# Patient Record
Sex: Female | Born: 1995
Health system: Southern US, Community
[De-identification: ages and names within clinical notes are randomized; demographics above are authoritative.]

## PROBLEM LIST (undated history)

## (undated) DIAGNOSIS — F419 Anxiety disorder, unspecified: Secondary | ICD-10-CM

## (undated) DIAGNOSIS — I700 Atherosclerosis of aorta: Secondary | ICD-10-CM

## (undated) DIAGNOSIS — M779 Enthesopathy, unspecified: Secondary | ICD-10-CM

## (undated) DIAGNOSIS — J329 Chronic sinusitis, unspecified: Secondary | ICD-10-CM

## (undated) DIAGNOSIS — F329 Major depressive disorder, single episode, unspecified: Secondary | ICD-10-CM

## (undated) DIAGNOSIS — F32A Depression, unspecified: Secondary | ICD-10-CM

## (undated) HISTORY — DX: Atherosclerosis of aorta: I70.0

## (undated) HISTORY — PX: WISDOM TOOTH EXTRACTION: SHX21

## (undated) HISTORY — DX: Enthesopathy, unspecified: M77.9

## (undated) HISTORY — DX: Chronic sinusitis, unspecified: J32.9

---
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
## 2015-04-24 ENCOUNTER — Encounter: Payer: Self-pay | Admitting: Family Medicine

## 2015-04-24 ENCOUNTER — Ambulatory Visit (INDEPENDENT_AMBULATORY_CARE_PROVIDER_SITE_OTHER): Payer: BLUE CROSS/BLUE SHIELD | Admitting: Family Medicine

## 2015-04-24 VITALS — BP 129/71 | HR 71 | Temp 97.5°F | Ht 65.0 in | Wt 217.0 lb

## 2015-04-24 DIAGNOSIS — F339 Major depressive disorder, recurrent, unspecified: Secondary | ICD-10-CM | POA: Insufficient documentation

## 2015-04-24 DIAGNOSIS — M545 Low back pain, unspecified: Secondary | ICD-10-CM

## 2015-04-24 DIAGNOSIS — F330 Major depressive disorder, recurrent, mild: Secondary | ICD-10-CM

## 2015-04-24 DIAGNOSIS — N926 Irregular menstruation, unspecified: Secondary | ICD-10-CM | POA: Diagnosis not present

## 2015-04-24 DIAGNOSIS — R635 Abnormal weight gain: Secondary | ICD-10-CM | POA: Diagnosis not present

## 2015-04-24 DIAGNOSIS — Z0000 Encounter for general adult medical examination without abnormal findings: Secondary | ICD-10-CM | POA: Insufficient documentation

## 2015-04-24 MED ORDER — ESCITALOPRAM OXALATE 10 MG PO TABS: 10.0000 mg | ORAL_TABLET | Freq: Every day | ORAL | Status: DC

## 2015-04-24 NOTE — Progress Notes (Signed)
BP 129/71 mmHg  Pulse 71  Temp(Src) 97.5 F (36.4 C)  Ht 5\' 5"  (1.651 m)  Wt 217 lb (98.431 kg)  BMI 36.11 kg/m2  SpO2 98%  LMP 03/22/2015 (Exact Date)   Subjective:    Patient ID: Yolanda Gardner, female    DOB: 1996/07/18, 19 y.o.   MRN: [id number]  HPI: Yolanda Gardner is a 19 y.o. female  Chief Complaint  Patient presents with  . Establish Care  . Depression    She has never been diagnosed with Depression, but feels like she may have it.   She has started to have problems with mood for 4-5 years; does not feel as good as she could; gets down once in a while; knows   Sleeps a little better now that she is back to sleep; over the summer would have trouble falling asleep but also early morning awakenings No alcohol; she does exercise, new gym at school and does treadmill and sit-ups; sleeping better now and walking a lot on campus and that is helping her get tuckered She does have some fatigue; mood does get worse during the winter; as it turns to winter, she is going to get worse; during the winter last year, friends could tell Does eat meat; does take multiple vitamin Depression runs in the family, mother has depression and is on medicine and does well with medicine; thinks she takes escitalopram  Depression screen [facility] 2/9 04/24/2015  Decreased Interest 1  Down, Depressed, Hopeless 2  PHQ - 2 Score 3  Altered sleeping 3  Tired, decreased energy 1  Change in appetite 3  Feeling bad or failure about yourself  2  Trouble concentrating 3  Moving slowly or fidgety/restless 1  Suicidal thoughts 1  PHQ-9 Score 17   She also has had some back problems; went to sports medicine doctor at school; her legs are uneven; she had pelvic tilt and she walks around on concrete a lot; pain is in the lower back; muscles will tighten up; going on for one year; mostly lower back; no shoe inserts; no b/b dysfunction; no numbness or tingling down the legs; comes and goes, not specifically  with any injury  Relevant past medical, surgical, family and social history reviewed and updated as indicated. Interim medical history since our last visit reviewed. Allergies and medications reviewed and updated. Mat aunt diabetes, mother depression, no known thyroid trouble in the family  Review of Systems  Constitutional: Positive for unexpected weight change (weight gain of maybe 15 pounds over summer).  Eyes: Negative for visual disturbance.  Gastrointestinal: Negative for blood in stool.  Endocrine: Positive for heat intolerance and polydipsia (lately, used to constantly drink water, more sodas during the summer). Negative for cold intolerance.  Genitourinary: Negative for hematuria.  Hematological: Does not bruise/bleed easily (just a couple bruises on the legs, but expected and active).  Psychiatric/Behavioral: Positive for decreased concentration (trouble with reading, staying on task, drifts off while reading, that's new). Negative for behavioral problems, confusion and self-injury. The patient is not nervous/anxious.   Periods have been irregular; did not have a period for four months; usually monthly or a little later;  Not sexually active No hair growth or acne  Per HPI unless specifically indicated above     Objective:    BP 129/71 mmHg  Pulse 71  Temp(Src) 97.5 F (36.4 C)  Ht 5\' 5"  (1.651 m)  Wt 217 lb (98.431 kg)  BMI 36.11 kg/m2  SpO2 98%  LMP 03/22/2015 (Exact Date)  Wt Readings from Last 3 Encounters:  04/24/15 217 lb (98.431 kg) (99 %*, Z = 2.17)   * Growth percentiles are based on CDC 2-20 Years data.    Physical Exam  Constitutional: She appears well-developed and well-nourished. No distress.  HENT:  Head: Normocephalic and atraumatic.  Eyes: EOM are normal. No scleral icterus.  Neck: No thyromegaly present.  Cardiovascular: Normal rate, regular rhythm and normal heart sounds.   No murmur heard. Pulmonary/Chest: Effort normal and breath sounds  normal. No respiratory distress. She has no wheezes.  Abdominal: She exhibits no distension.  Musculoskeletal: Normal range of motion. She exhibits no edema.       Lumbar back: She exhibits normal range of motion, no tenderness, no bony tenderness, no deformity and no spasm.  Neurological: She is alert. She displays no atrophy and no tremor. She exhibits normal muscle tone. Gait normal.  Skin: Skin is warm and dry. She is not diaphoretic. No pallor.  Psychiatric: She has a normal mood and affect. Her behavior is normal. Judgment and thought content normal.      Assessment & Plan:   Problem List Items Addressed This Visit      Other   Depression, major, recurrent - Primary    Discussed dx, treatment option, risk of suicidal ideation on medicine and reasons to seek immediate care; she agrees with starting medicine; close f/u, call before appt if needed; she demonstrates good insight, maturity so frontal lobe myelination may be more complete      Relevant Medications   escitalopram (LEXAPRO) 10 MG tablet   Other Relevant Orders   Vit D  25 hydroxy (rtn osteoporosis monitoring) (Completed)   Abnormal weight gain    Check TSH today      Relevant Orders   TSH (Completed)   Preventative health care    Physical not done today, just using code for the preventive health labs that were ordered      Relevant Orders   CBC with Differential/Platelet (Completed)   Comprehensive metabolic panel (Completed)   Lipid Panel w/o Chol/HDL Ratio (Completed)   Irregular periods    Check TSH; no acne or hirsutism, so will not start work-up for PCOS, though she is obese; monitor      Relevant Orders   TSH (Completed)   Bilateral low back pain without sciatica    No red flags; no need for imaging; will refer to PT      Relevant Orders   Ambulatory referral to Physical Therapy      Follow up plan: Return in about 4 weeks (around 05/22/2015) for medication follow-up.  An after-visit summary  was printed and given to the patient at check-out.  Please see the patient instructions which may contain other information and recommendations beyond what is mentioned above in the assessment and plan. Meds ordered this encounter  Medications  . PATADAY 0.2 % SOLN    Sig:     Refill:  0  . escitalopram (LEXAPRO) 10 MG tablet    Sig: Take 1 tablet (10 mg total) by mouth daily.    Dispense:  30 tablet    Refill:  0   Orders Placed This Encounter  Procedures  . CBC with Differential/Platelet  . Comprehensive metabolic panel  . Lipid Panel w/o Chol/HDL Ratio  . TSH  . Vit D  25 hydroxy (rtn osteoporosis monitoring)  . Ambulatory referral to Physical Therapy

## 2015-04-24 NOTE — Patient Instructions (Signed)
Try turmeric as a natural anti-inflammatory (for pain and arthritis). It comes in capsules where you buy aspirin and fish oil, but also as a spice where you buy pepper and garlic powder. We'll have you see the physical therapist We'll contact you about the lab results If you have not heard anything from my staff in a week about any orders/referrals/studies from today, please contact us here to follow-up [phone] Start the new medicine  Call me before next appt or seek medical help if needed or dark thoughts develop Return in 3-4 weeks

## 2015-04-25 LAB — COMPREHENSIVE METABOLIC PANEL
A/G RATIO: 1.5 (ref 1.1–2.5)
ALBUMIN: 4.4 g/dL (ref 3.5–5.5)
ALK PHOS: 86 IU/L (ref 43–101)
ALT: 32 IU/L (ref 0–32)
AST: 27 IU/L (ref 0–40)
BILIRUBIN TOTAL: 0.2 mg/dL (ref 0.0–1.2)
BUN / CREAT RATIO: 15 (ref 8–20)
BUN: 10 mg/dL (ref 6–20)
CHLORIDE: 100 mmol/L (ref 97–108)
CO2: 23 mmol/L (ref 18–29)
CREATININE: 0.65 mg/dL (ref 0.57–1.00)
Calcium: 9.5 mg/dL (ref 8.7–10.2)
GFR calc Af Amer: 150 mL/min/{1.73_m2} (ref >59)
GFR calc non Af Amer: 130 mL/min/{1.73_m2} (ref >59)
GLOBULIN, TOTAL: 3 g/dL (ref 1.5–4.5)
Glucose: 92 mg/dL (ref 65–99)
POTASSIUM: 4.4 mmol/L (ref 3.5–5.2)
SODIUM: 140 mmol/L (ref 134–144)
Total Protein: 7.4 g/dL (ref 6.0–8.5)

## 2015-04-25 LAB — LIPID PANEL W/O CHOL/HDL RATIO
CHOLESTEROL TOTAL: 187 mg/dL — AB (ref 100–169)
HDL: 45 mg/dL (ref >39)
LDL CALC: 120 mg/dL — AB (ref 0–109)
TRIGLYCERIDES: 112 mg/dL — AB (ref 0–89)
VLDL Cholesterol Cal: 22 mg/dL (ref 5–40)

## 2015-04-25 LAB — CBC WITH DIFFERENTIAL/PLATELET
Basophils Absolute: 0 10*3/uL (ref 0.0–0.2)
Basos: 1 %
EOS (ABSOLUTE): 0.1 10*3/uL (ref 0.0–0.4)
EOS: 2 %
HEMATOCRIT: 38.7 % (ref 34.0–46.6)
HEMOGLOBIN: 12.3 g/dL (ref 11.1–15.9)
Immature Grans (Abs): 0 10*3/uL (ref 0.0–0.1)
Immature Granulocytes: 0 %
LYMPHS ABS: 2.2 10*3/uL (ref 0.7–3.1)
Lymphs: 32 %
MCH: 25.4 pg — AB (ref 26.6–33.0)
MCHC: 31.8 g/dL (ref 31.5–35.7)
MCV: 80 fL (ref 79–97)
MONOCYTES: 9 %
Monocytes Absolute: 0.6 10*3/uL (ref 0.1–0.9)
NEUTROS ABS: 4 10*3/uL (ref 1.4–7.0)
Neutrophils: 56 %
Platelets: 425 10*3/uL — ABNORMAL HIGH (ref 150–379)
RBC: 4.85 x10E6/uL (ref 3.77–5.28)
RDW: 15.5 % — AB (ref 12.3–15.4)
WBC: 7 10*3/uL (ref 3.4–10.8)

## 2015-04-25 LAB — VITAMIN D 25 HYDROXY (VIT D DEFICIENCY, FRACTURES): Vit D, 25-Hydroxy: 19 ng/mL — ABNORMAL LOW (ref 30.0–100.0)

## 2015-04-25 LAB — TSH: TSH: 1.02 u[IU]/mL (ref 0.450–4.500)

## 2015-04-26 DIAGNOSIS — M545 Low back pain, unspecified: Secondary | ICD-10-CM | POA: Insufficient documentation

## 2015-04-26 NOTE — Assessment & Plan Note (Signed)
Physical not done today, just using code for the preventive health labs that were ordered

## 2015-04-26 NOTE — Assessment & Plan Note (Signed)
Check TSH; no acne or hirsutism, so will not start work-up for PCOS, though she is obese; monitor

## 2015-04-26 NOTE — Assessment & Plan Note (Signed)
Discussed dx, treatment option, risk of suicidal ideation on medicine and reasons to seek immediate care; she agrees with starting medicine; close f/u, call before appt if needed; she demonstrates good insight, maturity so frontal lobe myelination may be more complete

## 2015-04-26 NOTE — Assessment & Plan Note (Signed)
Check TSH today

## 2015-04-26 NOTE — Assessment & Plan Note (Signed)
No red flags; no need for imaging; will refer to PT

## 2015-04-27 ENCOUNTER — Encounter: Payer: Self-pay | Admitting: Family Medicine

## 2015-05-25 ENCOUNTER — Ambulatory Visit (INDEPENDENT_AMBULATORY_CARE_PROVIDER_SITE_OTHER): Payer: BLUE CROSS/BLUE SHIELD | Admitting: Family Medicine

## 2015-05-25 ENCOUNTER — Encounter: Payer: Self-pay | Admitting: Family Medicine

## 2015-05-25 VITALS — BP 105/69 | HR 65 | Temp 97.5°F | Wt 219.0 lb

## 2015-05-25 DIAGNOSIS — F330 Major depressive disorder, recurrent, mild: Secondary | ICD-10-CM

## 2015-05-25 DIAGNOSIS — E559 Vitamin D deficiency, unspecified: Secondary | ICD-10-CM | POA: Diagnosis not present

## 2015-05-25 DIAGNOSIS — M79601 Pain in right arm: Secondary | ICD-10-CM

## 2015-05-25 DIAGNOSIS — R6884 Jaw pain: Secondary | ICD-10-CM | POA: Diagnosis not present

## 2015-05-25 MED ORDER — ESCITALOPRAM OXALATE 10 MG PO TABS: 10.0000 mg | ORAL_TABLET | Freq: Every day | ORAL | Status: DC

## 2015-05-25 NOTE — Assessment & Plan Note (Signed)
Start supplementation of OTC vitamin D3; will have her take 2,000 iu daily for 3-4 months and then 1,000 iu daily; get some time outdoors (weather permitting)
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
## 2015-05-25 NOTE — Assessment & Plan Note (Signed)
Doing much better on the SSRI; continue same; PHQ-2 score went form 17 to 0; refills of medicine provided; call before next f/u if any problems
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
## 2015-11-24 ENCOUNTER — Ambulatory Visit (INDEPENDENT_AMBULATORY_CARE_PROVIDER_SITE_OTHER): Payer: BLUE CROSS/BLUE SHIELD | Admitting: Family Medicine

## 2015-11-24 ENCOUNTER — Encounter: Payer: Self-pay | Admitting: Family Medicine

## 2015-11-24 VITALS — BP 114/72 | HR 65 | Temp 98.3°F | Ht 64.5 in | Wt 225.0 lb

## 2015-11-24 DIAGNOSIS — F3341 Major depressive disorder, recurrent, in partial remission: Secondary | ICD-10-CM | POA: Diagnosis not present

## 2015-11-24 DIAGNOSIS — E559 Vitamin D deficiency, unspecified: Secondary | ICD-10-CM

## 2015-11-24 DIAGNOSIS — Z23 Encounter for immunization: Secondary | ICD-10-CM | POA: Diagnosis not present

## 2015-11-24 MED ORDER — ESCITALOPRAM OXALATE 10 MG PO TABS: 10.0000 mg | ORAL_TABLET | Freq: Every day | ORAL | Status: DC

## 2015-11-24 NOTE — Progress Notes (Signed)
BP 114/72 mmHg  Pulse 65  Temp(Src) 98.3 F (36.8 C)  Ht 5' 4.5" (1.638 m)  Wt 225 lb (102.059 kg)  BMI 38.04 kg/m2  SpO2 98%  LMP 11/02/2015 (Exact Date)   Subjective:    Patient ID: Yolanda Gardner, female    DOB: July 11, 1996, 20 y.o.   MRN: [id number]  HPI: Yolanda Gardner is a 20 y.o. female  Chief Complaint  Patient presents with  . Depression    follow up   Depression screen [facility] 2/9 11/24/2015 05/25/2015 04/24/2015  Decreased Interest 0 0 1  Down, Depressed, Hopeless 1 0 2  PHQ - 2 Score 1 0 3  Altered sleeping - - 3  Tired, decreased energy - - 1  Change in appetite - - 3  Feeling bad or failure about yourself  - - 2  Trouble concentrating - - 3  Moving slowly or fidgety/restless - - 1  Suicidal thoughts - - 1  PHQ-9 Score - - 17   Did get flu shot today; has not been sick Here primarily for follow-up of depression Doing well with mood and medicines; taking escitalopram for about 6 months; no shaking or tremors; sleeping okay; just feels a little down with social issues, has a cause; not out of nowhere; not having tearfulness; no hopelessness Taking vitamin D supplement, 1000 iu daily; we reviewed previous labs  Relevant past medical, surgical, social history reviewed and updated as indicated. Interim medical history since our last visit reviewed. Allergies and medications reviewed and updated.  Review of Systems Per HPI unless specifically indicated above     Objective:    BP 114/72 mmHg  Pulse 65  Temp(Src) 98.3 F (36.8 C)  Ht 5' 4.5" (1.638 m)  Wt 225 lb (102.059 kg)  BMI 38.04 kg/m2  SpO2 98%  LMP 11/02/2015 (Exact Date)  Wt Readings from Last 3 Encounters:  11/24/15 225 lb (102.059 kg) (99 %*, Z = 2.26)  05/25/15 219 lb (99.338 kg) (99 %*, Z = 2.19)  04/24/15 217 lb (98.431 kg) (99 %*, Z = 2.17)   * Growth percentiles are based on CDC 2-20 Years data.    Physical Exam  Constitutional: She appears well-developed and well-nourished. No  distress.  Eyes: EOM are normal. No scleral icterus.  Cardiovascular: Normal rate and regular rhythm.   Pulmonary/Chest: Effort normal and breath sounds normal.  Psychiatric: She has a normal mood and affect. Her behavior is normal. Judgment and thought content normal. She expresses no suicidal ideation.   Results for orders placed or performed in visit on 04/24/15  CBC with Differential/Platelet  Result Value Ref Range   WBC 7.0 3.4 - 10.8 x10E3/uL   RBC 4.85 3.77 - 5.28 x10E6/uL   Hemoglobin 12.3 11.1 - 15.9 g/dL   Hematocrit 40.9 81.1 - 46.6 %   MCV 80 79 - 97 fL   MCH 25.4 (L) 26.6 - 33.0 pg   MCHC 31.8 31.5 - 35.7 g/dL   RDW 91.4 (H) 78.2 - 95.6 %   Platelets 425 (H) 150 - 379 x10E3/uL   Neutrophils 56 %   Lymphs 32 %   Monocytes 9 %   Eos 2 %   Basos 1 %   Neutrophils Absolute 4.0 1.4 - 7.0 x10E3/uL   Lymphocytes Absolute 2.2 0.7 - 3.1 x10E3/uL   Monocytes Absolute 0.6 0.1 - 0.9 x10E3/uL   EOS (ABSOLUTE) 0.1 0.0 - 0.4 x10E3/uL   Basophils Absolute 0.0 0.0 - 0.2 x10E3/uL  Immature Granulocytes 0 %   Immature Grans (Abs) 0.0 0.0 - 0.1 x10E3/uL  Comprehensive metabolic panel  Result Value Ref Range   Glucose 92 65 - 99 mg/dL   BUN 10 6 - 20 mg/dL   Creatinine, Ser 1.470.65 0.57 - 1.00 mg/dL   GFR calc non Af Amer 130 >59 mL/min/1.73   GFR calc Af Amer 150 >59 mL/min/1.73   BUN/Creatinine Ratio 15 8 - 20   Sodium 140 134 - 144 mmol/L   Potassium 4.4 3.5 - 5.2 mmol/L   Chloride 100 97 - 108 mmol/L   CO2 23 18 - 29 mmol/L   Calcium 9.5 8.7 - 10.2 mg/dL   Total Protein 7.4 6.0 - 8.5 g/dL   Albumin 4.4 3.5 - 5.5 g/dL   Globulin, Total 3.0 1.5 - 4.5 g/dL   Albumin/Globulin Ratio 1.5 1.1 - 2.5   Bilirubin Total 0.2 0.0 - 1.2 mg/dL   Alkaline Phosphatase 86 43 - 101 IU/L   AST 27 0 - 40 IU/L   ALT 32 0 - 32 IU/L  Lipid Panel w/o Chol/HDL Ratio  Result Value Ref Range   Cholesterol, Total 187 (H) 100 - 169 mg/dL   Triglycerides 829112 (H) 0 - 89 mg/dL   HDL 45 >56>39 mg/dL    VLDL Cholesterol Cal 22 5 - 40 mg/dL   LDL Calculated 213120 (H) 0 - 109 mg/dL  TSH  Result Value Ref Range   TSH 1.020 0.450 - 4.500 uIU/mL  Vit D  25 hydroxy (rtn osteoporosis monitoring)  Result Value Ref Range   Vit D, 25-Hydroxy 19.0 (L) 30.0 - 100.0 ng/mL      Assessment & Plan:   Problem List Items Addressed This Visit      Other   Depression, major, recurrent (HCC) - Primary    Doing well on current dose of medicine; continue same; refills provided for one year, as she does not wish to stop medicine; of course, call sooner if any issues      Relevant Medications   escitalopram (LEXAPRO) 10 MG tablet   Vitamin D deficiency    Continue 1,000 iu vitamin D3; reviewed last lab results and instructions for labs; she does not wish to recheck the level today       Other Visit Diagnoses    Needs flu shot        Encounter for immunization        Relevant Orders    Flu Vaccine QUAD 36+ mos IM       Follow up plan: Return in about 1 year (around 11/23/2016) for follow-up.  Meds ordered this encounter  Medications  . escitalopram (LEXAPRO) 10 MG tablet    Sig: Take 1 tablet (10 mg total) by mouth daily.    Dispense:  30 tablet    Refill:  11   An after-visit summary was printed and given to the patient at check-out.  Please see the patient instructions which may contain other information and recommendations beyond what is mentioned above in the assessment and plan.

## 2015-11-24 NOTE — Assessment & Plan Note (Signed)
Continue 1,000 iu vitamin D3; reviewed last lab results and instructions for labs; she does not wish to recheck the level today

## 2015-11-24 NOTE — Assessment & Plan Note (Signed)
Doing well on current dose of medicine; continue same; refills provided for one year, as she does not wish to stop medicine; of course, call sooner if any issues

## 2015-11-24 NOTE — Patient Instructions (Addendum)
Return in one year, but call sooner if needed Continue medicines as before You received the flu shot today; it should protect you against the flu virus over the coming months; it will take about two weeks for antibodies to develop; do try to stay away from hospitals, nursing homes, and daycares during peak flu season; taking extra vitamin C daily during flu season may help you avoid getting sick

## 2016-11-18 DIAGNOSIS — J019 Acute sinusitis, unspecified: Secondary | ICD-10-CM | POA: Diagnosis not present

## 2016-11-21 DIAGNOSIS — M25531 Pain in right wrist: Secondary | ICD-10-CM | POA: Diagnosis not present

## 2016-12-26 ENCOUNTER — Ambulatory Visit (INDEPENDENT_AMBULATORY_CARE_PROVIDER_SITE_OTHER): Payer: BLUE CROSS/BLUE SHIELD | Admitting: Family Medicine

## 2016-12-26 ENCOUNTER — Encounter: Payer: Self-pay | Admitting: Family Medicine

## 2016-12-26 VITALS — BP 124/82 | HR 68 | Temp 98.3°F | Resp 16 | Wt 231.3 lb

## 2016-12-26 DIAGNOSIS — F3341 Major depressive disorder, recurrent, in partial remission: Secondary | ICD-10-CM | POA: Diagnosis not present

## 2016-12-26 DIAGNOSIS — R059 Cough, unspecified: Secondary | ICD-10-CM

## 2016-12-26 DIAGNOSIS — R05 Cough: Secondary | ICD-10-CM

## 2016-12-26 MED ORDER — BENZONATATE 100 MG PO CAPS: 100.0000 mg | ORAL_CAPSULE | Freq: Three times a day (TID) | ORAL | 0 refills | Status: DC | PRN

## 2016-12-26 MED ORDER — ESCITALOPRAM OXALATE 10 MG PO TABS: 10.0000 mg | ORAL_TABLET | Freq: Every day | ORAL | 3 refills | Status: DC

## 2016-12-26 NOTE — Assessment & Plan Note (Signed)
Discussed seasonal affective disorder, light therapy; hand-out from [facility] clinic given; will consider increasing lexapro to 20 mg during winter months; she agrees; continue 10 mg daily for now; call with any issues

## 2016-12-26 NOTE — Patient Instructions (Addendum)
Consider light therapy for December, January, February Let's do increase the Lexapro in December to 20 mg daily, just call me for a new prescription, and we can do that through winter

## 2016-12-26 NOTE — Progress Notes (Signed)
BP 124/82   Pulse 68   Temp 98.3 F (36.8 C) (Oral)   Resp 16   Wt 231 lb 4.8 oz (104.9 kg)   LMP 12/23/2016   SpO2 96%   BMI 39.09 kg/m    Subjective:    Patient ID: Yolanda Gardner, female    DOB: Mar 25, 1996, 20 y.o.   MRN: [id number]  HPI: Yolanda Gardner is a 21 y.o. female  Chief Complaint  Patient presents with  . Depression   HPI Patient is here for f/u She thinks she might benefit from a higher dose of lexapro during the winter Not sure if seasonal affect disorder No SI/HI Time for refill Taking 10 mg lexapro Can get through Christmas Getting self care  Can't seem to shake a cough; had a sinus infection one month ago; drainage got into lungs; no blood, no fever; some allergies, no recent postnasal drip; more with exertion, irritated  Depression screen [facility] 2/9 12/26/2016 11/24/2015 05/25/2015 04/24/2015  Decreased Interest 0 0 0 1  Down, Depressed, Hopeless 0 1 0 2  PHQ - 2 Score 0 1 0 3  Altered sleeping - - - 3  Tired, decreased energy - - - 1  Change in appetite - - - 3  Feeling bad or failure about yourself  - - - 2  Trouble concentrating - - - 3  Moving slowly or fidgety/restless - - - 1  Suicidal thoughts - - - 1  PHQ-9 Score - - - 17   Relevant past medical, surgical, family and social history reviewed Past Medical History:  Diagnosis Date  . Sinus infection   . Tendonitis    No past surgical history on file. Family History  Problem Relation Age of Onset  . Depression Mother   . Hypertension Maternal Grandmother   . Diabetes Maternal Aunt    Social History  Substance Use Topics  . Smoking status: Never Smoker  . Smokeless tobacco: Never Used  . 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 Allergies and medications reviewed  Review of Systems Per HPI unless specifically indicated above     Objective:    BP 124/82   Pulse 68   Temp 98.3 F  (36.8 C) (Oral)   Resp 16   Wt 231 lb 4.8 oz (104.9 kg)   LMP 12/23/2016   SpO2 96%   BMI 39.09 kg/m   Wt Readings from Last 3 Encounters:  12/26/16 231 lb 4.8 oz (104.9 kg)  11/24/15 225 lb (102.1 kg) (99 %, Z= 2.26)*  05/25/15 219 lb (99.3 kg) (99 %, Z= 2.19)*   * Growth percentiles are based on CDC 2-20 Years data.    Physical Exam  Constitutional: She appears well-developed and well-nourished. No distress.  HENT:  Mouth/Throat: Mucous membranes are normal.  Eyes: EOM are normal. No scleral icterus.  Cardiovascular: Normal rate and regular rhythm.   Pulmonary/Chest: Effort normal and breath sounds normal.  Psychiatric: She has a normal mood and affect. Her behavior is normal.   Results for orders placed or performed in visit on 04/24/15  CBC with Differential/Platelet  Result Value Ref Range   WBC 7.0 3.4 - 10.8 x10E3/uL   RBC 4.85 3.77 - 5.28 x10E6/uL   Hemoglobin 12.3 11.1 - 15.9 g/dL   Hematocrit 40.9 81.1 - 46.6 %   MCV 80 79 - 97 fL   MCH 25.4 (L) 26.6 -  33.0 pg   MCHC 31.8 31.5 - 35.7 g/dL   RDW 13.0 (H) 86.5 - 78.4 %   Platelets 425 (H) 150 - 379 x10E3/uL   Neutrophils 56 %   Lymphs 32 %   Monocytes 9 %   Eos 2 %   Basos 1 %   Neutrophils Absolute 4.0 1.4 - 7.0 x10E3/uL   Lymphocytes Absolute 2.2 0.7 - 3.1 x10E3/uL   Monocytes Absolute 0.6 0.1 - 0.9 x10E3/uL   EOS (ABSOLUTE) 0.1 0.0 - 0.4 x10E3/uL   Basophils Absolute 0.0 0.0 - 0.2 x10E3/uL   Immature Granulocytes 0 %   Immature Grans (Abs) 0.0 0.0 - 0.1 x10E3/uL  Comprehensive metabolic panel  Result Value Ref Range   Glucose 92 65 - 99 mg/dL   BUN 10 6 - 20 mg/dL   Creatinine, Ser 6.96 0.57 - 1.00 mg/dL   GFR calc non Af Amer 130 >59 mL/min/1.73   GFR calc Af Amer 150 >59 mL/min/1.73   BUN/Creatinine Ratio 15 8 - 20   Sodium 140 134 - 144 mmol/L   Potassium 4.4 3.5 - 5.2 mmol/L   Chloride 100 97 - 108 mmol/L   CO2 23 18 - 29 mmol/L   Calcium 9.5 8.7 - 10.2 mg/dL   Total Protein 7.4 6.0 - 8.5  g/dL   Albumin 4.4 3.5 - 5.5 g/dL   Globulin, Total 3.0 1.5 - 4.5 g/dL   Albumin/Globulin Ratio 1.5 1.1 - 2.5   Bilirubin Total 0.2 0.0 - 1.2 mg/dL   Alkaline Phosphatase 86 43 - 101 IU/L   AST 27 0 - 40 IU/L   ALT 32 0 - 32 IU/L  Lipid Panel w/o Chol/HDL Ratio  Result Value Ref Range   Cholesterol, Total 187 (H) 100 - 169 mg/dL   Triglycerides 295 (H) 0 - 89 mg/dL   HDL 45 >28 mg/dL   VLDL Cholesterol Cal 22 5 - 40 mg/dL   LDL Calculated 413 (H) 0 - 109 mg/dL  TSH  Result Value Ref Range   TSH 1.020 0.450 - 4.500 uIU/mL  Vit D  25 hydroxy (rtn osteoporosis monitoring)  Result Value Ref Range   Vit D, 25-Hydroxy 19.0 (L) 30.0 - 100.0 ng/mL      Assessment & Plan:   Problem List Items Addressed This Visit      Other   Depression, major, recurrent (HCC)    Discussed seasonal affective disorder, light therapy; hand-out from [facility] clinic given; will consider increasing lexapro to 20 mg during winter months; she agrees; continue 10 mg daily for now; call with any issues      Relevant Medications   escitalopram (LEXAPRO) 10 MG tablet    Other Visit Diagnoses    Cough    -  Primary   may be secondary to recent illness; will use tessalon perles for a week or two, should run its course       Follow up plan: Return in about 1 year (around 12/26/2017) for follow-up visit with Dr. Sherie Don; physical some time after you turn 21.  An after-visit summary was printed and given to the patient at check-out.  Please see the patient instructions which may contain other information and recommendations beyond what is mentioned above in the assessment and plan.  Meds ordered this encounter  Medications  . DISCONTD: naproxen (NAPROSYN) 250 MG tablet    Sig: Take by mouth once as needed.  . naproxen sodium (ANAPROX) 550 MG tablet    Sig: Take 550 mg  by mouth once as needed.    Refill:  0  . escitalopram (LEXAPRO) 10 MG tablet    Sig: Take 1 tablet (10 mg total) by mouth daily.    Dispense:   90 tablet    Refill:  3  . benzonatate (TESSALON PERLES) 100 MG capsule    Sig: Take 1 capsule (100 mg total) by mouth every 8 (eight) hours as needed for cough.    Dispense:  30 capsule    Refill:  0    No orders of the defined types were placed in this encounter.

## 2018-01-19 ENCOUNTER — Other Ambulatory Visit: Payer: Self-pay | Admitting: Family Medicine

## 2018-01-19 NOTE — Telephone Encounter (Signed)
Patient has not been seen in over a year She'll need an appointment please I refilled 30 days Thank you

## 2018-01-23 NOTE — Telephone Encounter (Signed)
Left voice message informing pt that prescription has been sent to pharmacy and for her to schedule appt

## 2018-02-22 ENCOUNTER — Other Ambulatory Visit: Payer: Self-pay | Admitting: Family Medicine

## 2018-02-22 NOTE — Telephone Encounter (Signed)
See 01/19/2018 note; I allowed 30 day refill and asked for her to schedule a visit Patient needs an appointment She has not been seen since April 2018

## 2018-02-22 NOTE — Telephone Encounter (Signed)
Left detailed voicemail

## 2018-02-26 ENCOUNTER — Ambulatory Visit: Payer: BLUE CROSS/BLUE SHIELD | Admitting: Family Medicine

## 2018-02-26 ENCOUNTER — Encounter: Payer: Self-pay | Admitting: Family Medicine

## 2018-02-26 VITALS — BP 120/82 | HR 80 | Temp 98.5°F | Resp 16 | Ht 65.0 in | Wt 239.3 lb

## 2018-02-26 DIAGNOSIS — F3341 Major depressive disorder, recurrent, in partial remission: Secondary | ICD-10-CM

## 2018-02-26 DIAGNOSIS — E669 Obesity, unspecified: Secondary | ICD-10-CM | POA: Diagnosis not present

## 2018-02-26 DIAGNOSIS — Z118 Encounter for screening for other infectious and parasitic diseases: Secondary | ICD-10-CM

## 2018-02-26 MED ORDER — ESCITALOPRAM OXALATE 10 MG PO TABS: 10.0000 mg | ORAL_TABLET | Freq: Every day | ORAL | 3 refills | Status: DC

## 2018-02-26 NOTE — Progress Notes (Signed)
BP 120/82 (BP Location: Left Arm, Patient Position: Sitting, Cuff Size: Large)   Pulse 80   Temp 98.5 F (36.9 C) (Oral)   Resp 16   Ht 5\' 5"  (1.651 m)   Wt 239 lb 4.8 oz (108.5 kg)   LMP 02/09/2018 (Exact Date)   SpO2 99%   BMI 39.82 kg/m    Subjective:    Patient ID: Yolanda Gardner, female    DOB: 11/23/1995, 22 y.o.   MRN: [id number]  HPI: Yolanda Gardner is a 22 y.o. female  Chief Complaint  Patient presents with  . Medication Refill    escitalopram 10mg  (request 90 day supply). patient stated that things are going well with meds    HPI Patient is here for f/u Since last visit, she went to the medical center over the winter for something like a cold; sick for 3 months, sinus infection that she could not cleared; lingered, then ear infection; two rounds of antibiotics  The SSRI is working well; major depression; no SI/HI; more anxious lately, works in [employer]; thinking and worrying; not keeping her up at night; no negative side effects; just here and there; not really anxious about anything right now; just things piling up at school; has one more class this coming semester and then graduating; [place]; living with grandmother to save money for the eventual move  Irregular periods have resolved; she has never been sexually active  Obesity; just stuck she says; when she was in high school, she lost 20 pounds, thinks she did weight watchers, maybe freshman or sophomore in high school; not sure what exactly she did; she has sweet tea once in a while; no regular sodas; mostly water or diet drinks; skips meals sometimes, either forgetting or whatever; sometimes eats in the car; sporadic eating, loses a little and then will gain it back; does squats and believes she has a fair amount of muscle mass  [facility] 2/9 02/26/2018 12/26/2016 11/24/2015 05/25/2015 04/24/2015  Decreased Interest 0 0 0 0 1  Down, Depressed, Hopeless 0 0 1 0 2    PHQ - 2 Score 0 0 1 0 3  Altered sleeping 0 - - - 3  Tired, decreased energy 0 - - - 1  Change in appetite 0 - - - 3  Feeling bad or failure about yourself  0 - - - 2  Trouble concentrating 0 - - - 3  Moving slowly or fidgety/restless 0 - - - 1  Suicidal thoughts 0 - - - 1  PHQ-9 Score 0 - - - 17    Relevant past medical, surgical, family and social history reviewed Past Medical History:  Diagnosis Date  . Sinus infection   . Tendonitis    History reviewed. No pertinent surgical history. Family History  Problem Relation Age of Onset  . Depression Mother   . Hypertension Maternal Grandmother   . Diabetes Maternal Aunt    Social History   Tobacco Use  . Smoking status: Never Smoker  . Smokeless tobacco: Never Used  Substance Use Topics  . Alcohol use: No  . Drug use: No    Interim medical history since last visit reviewed. Allergies and medications reviewed  Review of Systems Per HPI unless specifically indicated above     Objective:    BP 120/82 (BP Location: Left Arm, Patient Position: Sitting, Cuff Size: Large)   Pulse 80   Temp 98.5 F (36.9 C) (  Oral)   Resp 16   Ht 5\' 5"  (1.651 m)   Wt 239 lb 4.8 oz (108.5 kg)   LMP 02/09/2018 (Exact Date)   SpO2 99%   BMI 39.82 kg/m   Wt Readings from Last 3 Encounters:  02/26/18 239 lb 4.8 oz (108.5 kg)  12/26/16 231 lb 4.8 oz (104.9 kg)  11/24/15 225 lb (102.1 kg) (99 %, Z= 2.26)*   * Growth percentiles are based on CDC (Girls, 2-20 Years) data.    Physical Exam  Constitutional: She appears well-developed and well-nourished.  obese  HENT:  Mouth/Throat: Mucous membranes are normal.  Eyes: EOM are normal. No scleral icterus.  Cardiovascular: Normal rate and regular rhythm.  Pulmonary/Chest: Effort normal and breath sounds normal.  Psychiatric: She has a normal mood and affect. Her behavior is normal.   Results for orders placed or performed in visit on 04/24/15  CBC with Differential/Platelet  Result  Value Ref Range   WBC 7.0 3.4 - 10.8 x10E3/uL   RBC 4.85 3.77 - 5.28 x10E6/uL   Hemoglobin 12.3 11.1 - 15.9 g/dL   Hematocrit 16.1 09.6 - 46.6 %   MCV 80 79 - 97 fL   MCH 25.4 (L) 26.6 - 33.0 pg   MCHC 31.8 31.5 - 35.7 g/dL   RDW 04.5 (H) 40.9 - 81.1 %   Platelets 425 (H) 150 - 379 x10E3/uL   Neutrophils 56 %   Lymphs 32 %   Monocytes 9 %   Eos 2 %   Basos 1 %   Neutrophils Absolute 4.0 1.4 - 7.0 x10E3/uL   Lymphocytes Absolute 2.2 0.7 - 3.1 x10E3/uL   Monocytes Absolute 0.6 0.1 - 0.9 x10E3/uL   EOS (ABSOLUTE) 0.1 0.0 - 0.4 x10E3/uL   Basophils Absolute 0.0 0.0 - 0.2 x10E3/uL   Immature Granulocytes 0 %   Immature Grans (Abs) 0.0 0.0 - 0.1 x10E3/uL  Comprehensive metabolic panel  Result Value Ref Range   Glucose 92 65 - 99 mg/dL   BUN 10 6 - 20 mg/dL   Creatinine, Ser 9.14 0.57 - 1.00 mg/dL   GFR calc non Af Amer 130 >59 mL/min/1.73   GFR calc Af Amer 150 >59 mL/min/1.73   BUN/Creatinine Ratio 15 8 - 20   Sodium 140 134 - 144 mmol/L   Potassium 4.4 3.5 - 5.2 mmol/L   Chloride 100 97 - 108 mmol/L   CO2 23 18 - 29 mmol/L   Calcium 9.5 8.7 - 10.2 mg/dL   Total Protein 7.4 6.0 - 8.5 g/dL   Albumin 4.4 3.5 - 5.5 g/dL   Globulin, Total 3.0 1.5 - 4.5 g/dL   Albumin/Globulin Ratio 1.5 1.1 - 2.5   Bilirubin Total 0.2 0.0 - 1.2 mg/dL   Alkaline Phosphatase 86 43 - 101 IU/L   AST 27 0 - 40 IU/L   ALT 32 0 - 32 IU/L  Lipid Panel w/o Chol/HDL Ratio  Result Value Ref Range   Cholesterol, Total 187 (H) 100 - 169 mg/dL   Triglycerides 782 (H) 0 - 89 mg/dL   HDL 45 >95 mg/dL   VLDL Cholesterol Cal 22 5 - 40 mg/dL   LDL Calculated 621 (H) 0 - 109 mg/dL  TSH  Result Value Ref Range   TSH 1.020 0.450 - 4.500 uIU/mL  Vit D  25 hydroxy (rtn osteoporosis monitoring)  Result Value Ref Range   Vit D, 25-Hydroxy 19.0 (L) 30.0 - 100.0 ng/mL      Assessment & Plan:   Problem  List Items Addressed This Visit      Other   Obesity (BMI 35.0-39.9 without comorbidity)    Consider  nutritionist; discussed constancy; increase activity; see AVS      Relevant Orders   Amb ref to Medical Nutrition Therapy-MNT   Depression, major, recurrent (HCC) - Primary    Continue the SSRI      Relevant Medications   escitalopram (LEXAPRO) 10 MG tablet    Other Visit Diagnoses    Screening for chlamydial disease       Relevant Orders   C. trachomatis/N. gonorrhoeae RNA       Follow up plan: Return in about 1 month (around 03/26/2018) for complete physical.  An after-visit summary was printed and given to the patient at check-out.  Please see the patient instructions which may contain other information and recommendations beyond what is mentioned above in the assessment and plan.  Meds ordered this encounter  Medications  . escitalopram (LEXAPRO) 10 MG tablet    Sig: Take 1 tablet (10 mg total) by mouth daily.    Dispense:  90 tablet    Refill:  3    30 days only approved; appointment needed; thank you    Orders Placed This Encounter  Procedures  . C. trachomatis/N. gonorrhoeae RNA  . Amb ref to Medical Nutrition Therapy-MNT

## 2018-02-26 NOTE — Assessment & Plan Note (Signed)
Consider nutritionist; discussed constancy; increase activity; see AVS

## 2018-02-26 NOTE — Assessment & Plan Note (Signed)
Continue the SSRI

## 2018-02-26 NOTE — Patient Instructions (Addendum)
Check out the information at familydoctor.org entitled "Nutrition for Weight Loss: What You Need to Know about Fad Diets" Try to lose between 1-2 pounds per week by taking in fewer calories and burning off more calories You can succeed by limiting portions, limiting foods dense in calories and fat, becoming more active, and drinking 8 glasses of water a day (64 ounces) Don't skip meals, especially breakfast, as skipping meals may alter your metabolism Do not use over-the-counter weight loss pills or gimmicks that claim rapid weight loss A healthy BMI (or body mass index) is between 18.5 and 24.9 You can calculate your ideal BMI at the NIH website [employer]huhttp://www.nhlbi.nih.gov/health/educational/lose_wt/BMI/bmicalc.htm  12 Ways to Curb Anxiety  ?Anxiety is normal human sensation. It is what helped our ancestors survive the pitfalls of the wilderness. Anxiety is defined as experiencing worry or nervousness about an imminent event or something with an uncertain outcome. It is a feeling experienced by most people at some point in their lives. Anxiety can be triggered by a very personal issue, such as the illness of a loved one, or an event of global proportions, such as a refugee crisis. Some of the symptoms of anxiety are:  Feeling restless.  Having a feeling of impending danger.  Increased heart rate.  Rapid breathing. Sweating.  Shaking.  Weakness or feeling tired.  Difficulty concentrating on anything except the current worry.  Insomnia.  Stomach or bowel problems. What can we do about anxiety we may be feeling? There are many techniques to help manage stress and relax. Here are 12 ways you can reduce your anxiety almost immediately: 1. Turn off the constant feed of information. Take a social media sabbatical. Studies have shown that social media directly contributes to social anxiety.  2. Monitor your television viewing habits. Are you watching shows that are also contributing to your anxiety,  such as 24-hour news stations? Try watching something else, or better yet, nothing at all. Instead, listen to music, read an inspirational book or practice a hobby. 3. Eat nutritious meals. Also, don't skip meals and keep healthful snacks on hand. Hunger and poor diet contributes to feeling anxious. 4. Sleep. Sleeping on a regular schedule for at least seven to eight hours a night will do wonders for your outlook when you are awake. 5. Exercise. Regular exercise will help rid your body of that anxious energy and help you get more restful sleep. 6. Try deep (diaphragmatic) breathing. Inhale slowly through your nose for five seconds and exhale through your mouth. 7. Practice acceptance and gratitude. When anxiety hits, accept that there are things out of your control that shouldn't be of immediate concern.  8. Seek out humor. When anxiety strikes, watch a funny video, read jokes or call a friend who makes you laugh. Laughter is healing for our bodies and releases endorphins that are calming. 9. Stay positive. Take the effort to replace negative thoughts with positive ones. Try to see a stressful situation in a positive light. Try to come up with solutions rather than dwelling on the problem. 10. Figure out what triggers your anxiety. Keep a journal and make note of anxious moments and the events surrounding them. This will help you identify triggers you can avoid or even eliminate. 11. Talk to someone. Let a trusted friend, family member or even trained professional know that you are feeling overwhelmed and anxious. Verbalize what you are feeling and why.  12. Volunteer. If your anxiety is triggered by a crisis on a large scale,  become an advocate and work to resolve the problem that is causing you unease. Anxiety is often unwelcome and can become overwhelming. If not kept in check, it can become a disorder that could require medical treatment. However, if you take the time to care for yourself and avoid  the triggers that make you anxious, you will be able to find moments of relaxation and clarity that make your life much more enjoyable.

## 2018-02-27 LAB — C. TRACHOMATIS/N. GONORRHOEAE RNA
C. trachomatis RNA, TMA: NOT DETECTED
N. GONORRHOEAE RNA, TMA: NOT DETECTED

## 2018-03-29 ENCOUNTER — Ambulatory Visit (INDEPENDENT_AMBULATORY_CARE_PROVIDER_SITE_OTHER): Payer: BLUE CROSS/BLUE SHIELD | Admitting: Family Medicine

## 2018-03-29 ENCOUNTER — Other Ambulatory Visit (HOSPITAL_COMMUNITY)
Admission: RE | Admit: 2018-03-29 | Discharge: 2018-03-29 | Disposition: A | Discharge status: Home / Self Care | Payer: Self-pay | Source: Ambulatory Visit | Attending: Family Medicine | Admitting: Family Medicine

## 2018-03-29 ENCOUNTER — Encounter: Payer: Self-pay | Admitting: Family Medicine

## 2018-03-29 VITALS — BP 118/60 | HR 101 | Temp 98.1°F | Resp 12 | Ht 65.0 in | Wt 241.1 lb

## 2018-03-29 DIAGNOSIS — J069 Acute upper respiratory infection, unspecified: Secondary | ICD-10-CM | POA: Diagnosis not present

## 2018-03-29 DIAGNOSIS — Z124 Encounter for screening for malignant neoplasm of cervix: Secondary | ICD-10-CM | POA: Insufficient documentation

## 2018-03-29 DIAGNOSIS — Z0000 Encounter for general adult medical examination without abnormal findings: Secondary | ICD-10-CM

## 2018-03-29 DIAGNOSIS — Z113 Encounter for screening for infections with a predominantly sexual mode of transmission: Secondary | ICD-10-CM | POA: Diagnosis not present

## 2018-03-29 DIAGNOSIS — Z23 Encounter for immunization: Secondary | ICD-10-CM

## 2018-03-29 DIAGNOSIS — E6609 Other obesity due to excess calories: Secondary | ICD-10-CM | POA: Insufficient documentation

## 2018-03-29 DIAGNOSIS — Z6830 Body mass index (BMI) 30.0-30.9, adult: Secondary | ICD-10-CM | POA: Insufficient documentation

## 2018-03-29 LAB — CBC WITH DIFFERENTIAL/PLATELET
Basophils Absolute: 63 cells/uL (ref 0–200)
Basophils Relative: 0.8 %
Eosinophils Absolute: 119 cells/uL (ref 15–500)
Eosinophils Relative: 1.5 %
HCT: 42.7 % (ref 35.0–45.0)
Hemoglobin: 14.6 g/dL (ref 11.7–15.5)
Lymphs Abs: 2797 cells/uL (ref 850–3900)
MCH: 28.6 pg (ref 27.0–33.0)
MCHC: 34.2 g/dL (ref 32.0–36.0)
MCV: 83.7 fL (ref 80.0–100.0)
MONOS PCT: 7.3 %
MPV: 9.9 fL (ref 7.5–12.5)
NEUTROS PCT: 55 %
Neutro Abs: 4345 cells/uL (ref 1500–7800)
PLATELETS: 390 10*3/uL (ref 140–400)
RBC: 5.1 10*6/uL (ref 3.80–5.10)
RDW: 12.5 % (ref 11.0–15.0)
TOTAL LYMPHOCYTE: 35.4 %
WBC: 7.9 10*3/uL (ref 3.8–10.8)
WBCMIX: 577 {cells}/uL (ref 200–950)

## 2018-03-29 LAB — COMPLETE METABOLIC PANEL WITH GFR
AG Ratio: 1.6 (calc) (ref 1.0–2.5)
ALT: 35 U/L — AB (ref 6–29)
AST: 24 U/L (ref 10–30)
Albumin: 4.6 g/dL (ref 3.6–5.1)
Alkaline phosphatase (APISO): 65 U/L (ref 33–115)
BILIRUBIN TOTAL: 0.4 mg/dL (ref 0.2–1.2)
BUN: 11 mg/dL (ref 7–25)
CALCIUM: 9.6 mg/dL (ref 8.6–10.2)
CHLORIDE: 106 mmol/L (ref 98–110)
CO2: 22 mmol/L (ref 20–32)
Creat: 0.78 mg/dL (ref 0.50–1.10)
GFR, Est African American: 126 mL/min/{1.73_m2} (ref >=60)
GFR, Est Non African American: 109 mL/min/{1.73_m2} (ref >=60)
GLUCOSE: 97 mg/dL (ref 65–139)
Globulin: 2.9 g/dL (calc) (ref 1.9–3.7)
Potassium: 4.6 mmol/L (ref 3.5–5.3)
Sodium: 138 mmol/L (ref 135–146)
Total Protein: 7.5 g/dL (ref 6.1–8.1)

## 2018-03-29 LAB — LIPID PANEL
Cholesterol: 207 mg/dL — ABNORMAL HIGH (ref <200)
HDL: 39 mg/dL — AB (ref >50)
LDL Cholesterol (Calc): 140 mg/dL (calc) — ABNORMAL HIGH
Non-HDL Cholesterol (Calc): 168 mg/dL (calc) — ABNORMAL HIGH (ref <130)
TRIGLYCERIDES: 150 mg/dL — AB (ref <150)
Total CHOL/HDL Ratio: 5.3 (calc) — ABNORMAL HIGH (ref <5.0)

## 2018-03-29 LAB — TSH: TSH: 1.24 mIU/L

## 2018-03-29 NOTE — Progress Notes (Signed)
BP 118/60 (BP Location: Right Arm, Patient Position: Sitting, Cuff Size: Normal)   Pulse (!) 101   Temp 98.1 F (36.7 C) (Oral)   Resp 12   Ht 5\' 5"  (1.651 m)   Wt 241 lb 1.6 oz (109.4 kg)   LMP 02/09/2018 (Exact Date)   SpO2 97%   BMI 40.12 kg/m    Subjective:    Patient ID: Yolanda Gardner, female    DOB: 03/29/1996, 22 y.o.   MRN: [id number]  HPI: Yolanda Gardner is a 22 y.o. female  Chief Complaint  Patient presents with  . Annual Exam  . URI    x 1 week productive cough with greenish-yellow mucous, headache, sinus pressure, nasal congestion. She has been treating symptoms with Alka-Seltzer Plus Cold.    HPI Patient is here for a physical, but she has another problem She has been sick for about a week; coughin up greenish-yellow mucous; she has had some sinus pressure, headache, and nasal congestion; meds tried include Alka-Seltzer Plus cold medicine- with minimal help. Feels like it has decreased some- less runny nose and more pressure behind eyes. [place] plane[place] [facility]; feels like she is getting better  USPSTF grade A and B recommendations Depression:  [facility] 2/9 03/29/2018 02/26/2018 12/26/2016 11/24/2015 05/25/2015  Decreased Interest 0 0 0 0 0  Down, Depressed, Hopeless 0 0 0 1 0  PHQ - 2 Score 0 0 0 1 0  Altered sleeping 1 0 - - -  Tired, decreased energy 1 0 - - -  Change in appetite 0 0 - - -  Feeling bad or failure about yourself  0 0 - - -  Trouble concentrating 0 0 - - -  Moving slowly or fidgety/restless 0 0 - - -  Suicidal thoughts 0 0 - - -  PHQ-9 Score 2 0 - - -   Hypertension: BP Readings from Last 3 Encounters:  03/29/18 118/60  02/26/18 120/82  12/26/16 124/82   Obesity: discussed; she was referred to dietician last time, but will go later in the year; she is going to start Weight Watchers; will do with grandmother Wt Readings from Last 3 Encounters:  03/29/18 241 lb 1.6 oz (109.4 kg)  02/26/18 239 lb  4.8 oz (108.5 kg)  12/26/16 231 lb 4.8 oz (104.9 kg)   BMI Readings from Last 3 Encounters:  03/29/18 40.12 kg/m  02/26/18 39.82 kg/m  12/26/16 39.09 kg/m    Skin cancer: wears sunscreen  Lung cancer:  Does not smoke Breast cancer: no family or personal history  Colorectal cancer: denies blood in stool, no family or personal history  Cervical cancer screening: Due for a pap smear. Never been sexually active; trouble inserting tampons; no bleeding other periods HIV, hep B, hep C: virgin, no IV drugs, declines STD testing and prevention (chl/gon/syphilis): declines  Intimate partner violence: no abuse Contraception: Virgin Osteoporosis/Fall prevention/vitamin D: discussed importance of calcium and vitamin D  Immunizations:  Diet:  Trying to have a more regular diet; states varies a lot- yesterday had lean cuisine and later in the day had chicken, corn, beans and corn bread. Today has just eaten a banana.  Drink: water- 1L bottle 3 a day.  and diet coke Exercise: walk around apartments 30-40 minutes 4 times a week and doing squats and sit ups when she gets home. Started this 3 weeks ago.  Alcohol:    [facility]  AUDIT-C Score  0     Tobacco use: never  AAA: n/a Aspirin:n/a Glucose:  Glucose  Date Value Ref Range Status  04/24/2015 92 65 - 99 mg/dL Final   Lipids:  Lab Results  Component Value Date   CHOL 187 (H) 04/24/2015   Lab Results  Component Value Date   HDL 45 04/24/2015   Lab Results  Component Value Date   LDLCALC 120 (H) 04/24/2015   Lab Results  Component Value Date   TRIG 112 (H) 04/24/2015   No results found for: CHOLHDL No results found for: LDLDIRECT   Depression screen [facility] 2/9 03/29/2018 02/26/2018 12/26/2016 11/24/2015 05/25/2015  Decreased Interest 0 0 0 0 0  Down, Depressed, Hopeless 0 0 0 1 0  PHQ - 2 Score 0 0 0 1 0  Altered sleeping 1 0 - - -  Tired, decreased energy 1 0 - - -  Change in  appetite 0 0 - - -  Feeling bad or failure about yourself  0 0 - - -  Trouble concentrating 0 0 - - -  Moving slowly or fidgety/restless 0 0 - - -  Suicidal thoughts 0 0 - - -  PHQ-9 Score 2 0 - - -    Relevant past medical, surgical, family and social history reviewed Past Medical History:  Diagnosis Date  . Sinus infection   . Tendonitis    History reviewed. No pertinent surgical history. Family History  Problem Relation Age of Onset  . Depression Mother   . Hypertension Maternal Grandmother   . Diabetes Maternal Aunt    Social History   Tobacco Use  . Smoking status: Never Smoker  . Smokeless tobacco: Never Used  Substance Use Topics  . Alcohol use: No  . Drug use: No    Interim medical history since last visit reviewed. Allergies and medications reviewed  Review of Systems Per HPI unless specifically indicated above     Objective:    BP 118/60 (BP Location: Right Arm, Patient Position: Sitting, Cuff Size: Normal)   Pulse (!) 101   Temp 98.1 F (36.7 C) (Oral)   Resp 12   Ht 5\' 5"  (1.651 m)   Wt 241 lb 1.6 oz (109.4 kg)   LMP 02/09/2018 (Exact Date)   SpO2 97%   BMI 40.12 kg/m   Wt Readings from Last 3 Encounters:  03/29/18 241 lb 1.6 oz (109.4 kg)  02/26/18 239 lb 4.8 oz (108.5 kg)  12/26/16 231 lb 4.8 oz (104.9 kg)    Physical Exam  Constitutional: She appears well-developed and well-nourished.  HENT:  Head: Normocephalic and atraumatic.  Eyes: Conjunctivae and EOM are normal. Right eye exhibits no hordeolum. Left eye exhibits no hordeolum. No scleral icterus.  Neck: Carotid bruit is not present. No thyromegaly present.  Cardiovascular: Normal rate, regular rhythm, S1 normal, S2 normal and normal heart sounds.  No extrasystoles are present.  Pulmonary/Chest: Effort normal and breath sounds normal. No respiratory distress. Right breast exhibits no inverted nipple, no mass, no nipple discharge, no skin change and no tenderness. Left breast exhibits no  inverted nipple, no mass, no nipple discharge, no skin change and no tenderness. Breasts are symmetrical.  Abdominal: Soft. Normal appearance and bowel sounds are normal. She exhibits no distension, no abdominal bruit, no pulsatile midline mass and no mass. There is no hepatosplenomegaly. There is no tenderness. No hernia.  Genitourinary: Uterus normal. Pelvic exam was performed with patient prone. There is no  rash or lesion on the right labia. There is no rash or lesion on the left labia. Cervix exhibits no motion tenderness. Right adnexum displays no mass, no tenderness and no fullness. Left adnexum displays no mass, no tenderness and no fullness.  Musculoskeletal: Normal range of motion. She exhibits no edema.  Lymphadenopathy:       Head (right side): No submandibular adenopathy present.       Head (left side): No submandibular adenopathy present.    She has no cervical adenopathy.    She has no axillary adenopathy.  Neurological: She is alert. She displays no tremor. No cranial nerve deficit. She exhibits normal muscle tone. Gait normal.  Skin: Skin is warm and dry. No bruising and no ecchymosis noted. No cyanosis. No pallor.  Psychiatric: Her speech is normal and behavior is normal. Thought content normal. Her mood appears not anxious. She does not exhibit a depressed mood.    Results for orders placed or performed in visit on 02/26/18  C. trachomatis/N. gonorrhoeae RNA  Result Value Ref Range   C. trachomatis RNA, TMA NOT DETECTED NOT DETECT   N. gonorrhoeae RNA, TMA NOT DETECTED NOT DETECT      Assessment & Plan:   Problem List Items Addressed This Visit      Other   Preventative health care - Primary    USPSTF grade A and B recommendations reviewed with patient; age-appropriate recommendations, preventive care, screening tests, etc discussed and encouraged; healthy living encouraged; see AVS for patient education given to patient       Morbid obesity (HCC)    Encouraged  weight loss; she will start Weight Watchers; has support from family; check TSH today; she will see nutritionist later       Other Visit Diagnoses    Need for Tdap vaccination       Relevant Orders   Tdap vaccine greater than or equal to 7yo IM (Completed)   CBC with Differential/Platelet   COMPLETE METABOLIC PANEL WITH GFR   Lipid panel   TSH   Screen for STD (sexually transmitted disease)       Relevant Orders   Urine cytology ancillary only   Cervical cancer screening       Relevant Orders   Cytology - PAP   Upper respiratory infection, viral       no antibiotics indicated; patient may call in a few days if worsening, symptoms change, etc to readdress, but should resolve on its own       Follow up plan: Return in about 1 year (around 03/30/2019) for complete physical.  An after-visit summary was printed and given to the patient at check-out.  Please see the patient instructions which may contain other information and recommendations beyond what is mentioned above in the assessment and plan.  No orders of the defined types were placed in this encounter.   Orders Placed This Encounter  Procedures  . Tdap vaccine greater than or equal to 7yo IM  . CBC with Differential/Platelet  . COMPLETE METABOLIC PANEL WITH GFR  . Lipid panel  . TSH

## 2018-03-29 NOTE — Assessment & Plan Note (Signed)
USPSTF grade A and B recommendations reviewed with patient; age-appropriate recommendations, preventive care, screening tests, etc discussed and encouraged; healthy living encouraged; see AVS for patient education given to patient  

## 2018-03-29 NOTE — Patient Instructions (Addendum)
-If you are not continuing to improve with OTC treatments or get worse in the next 2 days call and let us know.   You likely have a viral upper respiratory infection (URI). Antibiotics will not reduce the number of days you are ill or prevent you from getting bacterial rhinosinusitis. A URI can take up to 14 days to resolve, but typically last between 7-11 days. Your body is so smart and strong that it will be fighting this illness off for you but it is important that you drink plenty of fluids, rest. Cover your nose/mouth when you cough or sneeze and wash your hands well and often. Here are some helpful things you can use or pick up over the counter from the pharmacy to help with your symptoms:   For Fever/Pain: Acetaminophen every 6 hours as needed (maximum of 3046m a day). If you are still uncomfortable you can add ibuprofen OR naproxen  For coughing: try dextromethorphan for a cough suppressant, and/or a cool mist humidifier, lozenges  For sore throat: saline gargles, honey herbal tea, lozenges, throat spray  To dry out your nose: try an antihistamine like loratadine (non-sedating) or diphenhydramine (sedating) or others To relieve a stuffy nose: try an oral decongestant  Like pseudoephedrine if you are under the age of 566and do not have high blood pressure, neti pot To make blowing your nose easier: guaifenesin   Check out the information at familydoctor.org entitled "Nutrition for Weight Loss: What You Need to Know about Fad Diets" Try to lose between 1-2 pounds per week by taking in fewer calories and burning off more calories You can succeed by limiting portions, limiting foods dense in calories and fat, becoming more active, and drinking 8 glasses of water a day (64 ounces) Don't skip meals, especially breakfast, as skipping meals may alter your metabolism Do not use over-the-counter weight loss pills or gimmicks that claim rapid weight loss A healthy BMI (or body mass index) is between  18.5 and 24.9 You can calculate your ideal BMI at the NBartlettwebsite hClubMonetize.fr   Preventing Unhealthy Weight Gain, Adult Staying at a healthy weight is important. When fat builds up in your body, you may become overweight or obese. These conditions put you at greater risk for developing certain health problems, such as heart disease, diabetes, sleeping problems, joint problems, and some cancers. Unhealthy weight gain is often the result of making unhealthy choices in what you eat. It is also a result of not getting enough exercise. You can make changes to your lifestyle to prevent obesity and stay as healthy as possible. What nutrition changes can be made? To maintain a healthy weight and prevent obesity:  Eat only as much as your body needs. To do this: ? Pay attention to signs that you are hungry or full. Stop eating as soon as you feel full. ? If you feel hungry, try drinking water first. Drink enough water so your urine is clear or pale yellow. ? Eat smaller portions. ? Look at serving sizes on food labels. Most foods contain more than one serving per container. ? Eat the recommended amount of calories for your gender and activity level. While most active people should eat around 2,000 calories per day, if you are trying to lose weight or are not very active, you main need to eat less calories. Talk to your health care provider or dietitian about how many calories you should eat each day.  Choose healthy foods, such as: ?  Fruits and vegetables. Try to fill at least half of your plate at each meal with fruits and vegetables. ? Whole grains, such as whole wheat bread, brown rice, and quinoa. ? Lean meats, such as chicken or fish. ? Other healthy proteins, such as beans, eggs, or tofu. ? Healthy fats, such as nuts, seeds, fatty fish, and olive oil. ? Low-fat or fat-free dairy.  Check food labels and avoid food and drinks that: ? Are  high in calories. ? Have added sugar. ? Are high in sodium. ? Have saturated fats or trans fats.  Limit how much you eat of the following foods: ? Prepackaged meals. ? Fast food. ? Fried foods. ? Processed meat, such as bacon, sausage, and deli meats. ? Fatty cuts of red meat and poultry with skin.  Cook foods in healthier ways, such as by baking, broiling, or grilling.  When grocery shopping, try to shop around the outside of the store. This helps you buy mostly fresh foods and avoid canned and prepackaged foods.  What lifestyle changes can be made?  Exercise at least 30 minutes 5 or more days each week. Exercising includes brisk walking, yard work, biking, running, swimming, and team sports like basketball and soccer. Ask your health care provider which exercises are safe for you.  Do not use any products that contain nicotine or tobacco, such as cigarettes and e-cigarettes. If you need help quitting, ask your health care provider.  Limit alcohol intake to no more than 1 drink a day for nonpregnant women and 2 drinks a day for men. One drink equals 12 oz of beer, 5 oz of wine, or 1 oz of hard liquor.  Try to get 7-9 hours of sleep each night. What other changes can be made?  Keep a food and activity journal to keep track of: ? What you ate and how many calories you had. Remember to count sauces, dressings, and side dishes. ? Whether you were active, and what exercises you did. ? Your calorie, weight, and activity goals.  Check your weight regularly. Track any changes. If you notice you have gained weight, make changes to your diet or activity routine.  Avoid taking weight-loss medicines or supplements. Talk to your health care provider before starting any new medicine or supplement.  Talk to your health care provider before trying any new diet or exercise plan. Why are these changes important? Eating healthy, staying active, and having healthy habits not only help prevent  obesity, they also:  Help you to manage stress and emotions.  Help you to connect with friends and family.  Improve your self-esteem.  Improve your sleep.  Prevent long-term health problems.  What can happen if changes are not made? Being obese or overweight can cause you to develop joint or bone problems, which can make it hard for you to stay active or do activities you enjoy. Being obese or overweight also puts stress on your heart and lungs and can lead to health problems like diabetes, heart disease, and some cancers. Where to find more information: Talk with your health care provider or a dietitian about healthy eating and healthy lifestyle choices. You may also find other information through these resources:  U.[place]. Department of Agriculture MyPlate: FormerBoss.no  American Heart Association: www.heart.org  Centers for Disease Control and Prevention: http://www.wolf.info/  Summary  Staying at a healthy weight is important. It helps prevent certain diseases and health problems, such as heart disease, diabetes, joint problems, sleep disorders, and some cancers.  Being obese or overweight can cause you to develop joint or bone problems, which can make it hard for you to stay active or do activities you enjoy.  You can prevent unhealthy weight gain by eating a healthy diet, exercising regularly, not smoking, limiting alcohol, and getting enough sleep.  Talk with your health care provider or a dietitian for guidance about healthy eating and healthy lifestyle choices. This information is not intended to replace advice given to you by your health care provider. Make sure you discuss any questions you have with your health care provider. Document Released: 08/16/2016 Document Revised: 09/21/2016 Document Reviewed: 09/21/2016 Elsevier Interactive Patient Education  2018 [place] Maintenance, Female Adopting a healthy lifestyle and getting preventive care can go a long way  to promote health and wellness. Talk with your health care provider about what schedule of regular examinations is right for you. This is a good chance for you to check in with your provider about disease prevention and staying healthy. In between checkups, there are plenty of things you can do on your own. Experts have done a lot of research about which lifestyle changes and preventive measures are most likely to keep you healthy. Ask your health care provider for more information. Weight and diet Eat a healthy diet  Be sure to include plenty of vegetables, fruits, low-fat dairy products, and lean protein.  Do not eat a lot of foods high in solid fats, added sugars, or salt.  Get regular exercise. This is one of the most important things you can do for your health. ? Most adults should exercise for at least 150 minutes each week. The exercise should increase your heart rate and make you sweat (moderate-intensity exercise). ? Most adults should also do strengthening exercises at least twice a week. This is in addition to the moderate-intensity exercise.  Maintain a healthy weight  Body mass index (BMI) is a measurement that can be used to identify possible weight problems. It estimates body fat based on height and weight. Your health care provider can help determine your BMI and help you achieve or maintain a healthy weight.  For females 43 years of age and older: ? A BMI below 18.5 is considered underweight. ? A BMI of 18.5 to 24.9 is normal. ? A BMI of 25 to 29.9 is considered overweight. ? A BMI of 30 and above is considered obese.  Watch levels of cholesterol and blood lipids  You should start having your blood tested for lipids and cholesterol at 22 years of age, then have this test every 5 years.  You may need to have your cholesterol levels checked more often if: ? Your lipid or cholesterol levels are high. ? You are older than 22 years of age. ? You are at high risk for heart  disease.  Cancer screening Lung Cancer  Lung cancer screening is recommended for adults 41-81 years old who are at high risk for lung cancer because of a history of smoking.  A yearly low-dose CT scan of the lungs is recommended for people who: ? Currently smoke. ? Have quit within the past 15 years. ? Have at least a 30-pack-year history of smoking. A pack year is smoking an average of one pack of cigarettes a day for 1 year.  Yearly screening should continue until it has been 15 years since you quit.  Yearly screening should stop if you develop a health problem that would prevent you from having lung cancer treatment.  Breast Cancer  Practice breast self-awareness. This means understanding how your breasts normally appear and feel.  It also means doing regular breast self-exams. Let your health care provider know about any changes, no matter how small.  If you are in your 20s or 30s, you should have a clinical breast exam (CBE) by a health care provider every 1-3 years as part of a regular health exam.  If you are 44 or older, have a CBE every year. Also consider having a breast X-ray (mammogram) every year.  If you have a family history of breast cancer, talk to your health care provider about genetic screening.  If you are at high risk for breast cancer, talk to your health care provider about having an MRI and a mammogram every year.  Breast cancer gene (BRCA) assessment is recommended for women who have family members with BRCA-related cancers. BRCA-related cancers include: ? Breast. ? Ovarian. ? Tubal. ? Peritoneal cancers.  Results of the assessment will determine the need for genetic counseling and BRCA1 and BRCA2 testing.  Cervical Cancer Your health care provider may recommend that you be screened regularly for cancer of the pelvic organs (ovaries, uterus, and vagina). This screening involves a pelvic examination, including checking for microscopic changes to the  surface of your cervix (Pap test). You may be encouraged to have this screening done every 3 years, beginning at age 32.  For women ages 19-65, health care providers may recommend pelvic exams and Pap testing every 3 years, or they may recommend the Pap and pelvic exam, combined with testing for human papilloma virus (HPV), every 5 years. Some types of HPV increase your risk of cervical cancer. Testing for HPV may also be done on women of any age with unclear Pap test results.  Other health care providers may not recommend any screening for nonpregnant women who are considered low risk for pelvic cancer and who do not have symptoms. Ask your health care provider if a screening pelvic exam is right for you.  If you have had past treatment for cervical cancer or a condition that could lead to cancer, you need Pap tests and screening for cancer for at least 20 years after your treatment. If Pap tests have been discontinued, your risk factors (such as having a new sexual partner) need to be reassessed to determine if screening should resume. Some women have medical problems that increase the chance of getting cervical cancer. In these cases, your health care provider may recommend more frequent screening and Pap tests.  Colorectal Cancer  This type of cancer can be detected and often prevented.  Routine colorectal cancer screening usually begins at 22 years of age and continues through 22 years of age.  Your health care provider may recommend screening at an earlier age if you have risk factors for colon cancer.  Your health care provider may also recommend using home test kits to check for hidden blood in the stool.  A small camera at the end of a tube can be used to examine your colon directly (sigmoidoscopy or colonoscopy). This is done to check for the earliest forms of colorectal cancer.  Routine screening usually begins at age 73.  Direct examination of the colon should be repeated every 5-10  years through 22 years of age. However, you may need to be screened more often if early forms of precancerous polyps or small growths are found.  Skin Cancer  Check your skin from head to toe regularly.  Tell  your health care provider about any new moles or changes in moles, especially if there is a change in a mole's shape or color.  Also tell your health care provider if you have a mole that is larger than the size of a pencil eraser.  Always use sunscreen. Apply sunscreen liberally and repeatedly throughout the day.  Protect yourself by wearing long sleeves, pants, a wide-brimmed hat, and sunglasses whenever you are outside.  Heart disease, diabetes, and high blood pressure  High blood pressure causes heart disease and increases the risk of stroke. High blood pressure is more likely to develop in: ? People who have blood pressure in the high end of the normal range (130-139/85-89 mm Hg). ? People who are overweight or obese. ? People who are African American.  If you are 76-38 years of age, have your blood pressure checked every 3-5 years. If you are 27 years of age or older, have your blood pressure checked every year. You should have your blood pressure measured twice-once when you are at a hospital or clinic, and once when you are not at a hospital or clinic. Record the average of the two measurements. To check your blood pressure when you are not at a hospital or clinic, you can use: ? An automated blood pressure machine at a pharmacy. ? A home blood pressure monitor.  If you are between 6 years and 31 years old, ask your health care provider if you should take aspirin to prevent strokes.  Have regular diabetes screenings. This involves taking a blood sample to check your fasting blood sugar level. ? If you are at a normal weight and have a low risk for diabetes, have this test once every three years after 22 years of age. ? If you are overweight and have a high risk for  diabetes, consider being tested at a younger age or more often. Preventing infection Hepatitis B  If you have a higher risk for hepatitis B, you should be screened for this virus. You are considered at high risk for hepatitis B if: ? You were born in a country where hepatitis B is common. Ask your health care provider which countries are considered high risk. ? Your parents were born in a high-risk country, and you have not been immunized against hepatitis B (hepatitis B vaccine). ? You have HIV or AIDS. ? You use needles to inject street drugs. ? You live with someone who has hepatitis B. ? You have had sex with someone who has hepatitis B. ? You get hemodialysis treatment. ? You take certain medicines for conditions, including cancer, organ transplantation, and autoimmune conditions.  Hepatitis C  Blood testing is recommended for: ? Everyone born from [birth year] through 1965. ? Anyone with known risk factors for hepatitis C.  Sexually transmitted infections (STIs)  You should be screened for sexually transmitted infections (STIs) including gonorrhea and chlamydia if: ? You are sexually active and are younger than 22 years of age. ? You are older than 22 years of age and your health care provider tells you that you are at risk for this type of infection. ? Your sexual activity has changed since you were last screened and you are at an increased risk for chlamydia or gonorrhea. Ask your health care provider if you are at risk.  If you do not have HIV, but are at risk, it may be recommended that you take a prescription medicine daily to prevent HIV infection. This is called pre-exposure prophylaxis (  PrEP). You are considered at risk if: ? You are sexually active and do not regularly use condoms or know the HIV status of your partner(s). ? You take drugs by injection. ? You are sexually active with a partner who has HIV.  Talk with your health care provider about whether you are at high risk  of being infected with HIV. If you choose to begin PrEP, you should first be tested for HIV. You should then be tested every 3 months for as long as you are taking PrEP. Pregnancy  If you are premenopausal and you may become pregnant, ask your health care provider about preconception counseling.  If you may become pregnant, take 400 to 800 micrograms (mcg) of folic acid every day.  If you want to prevent pregnancy, talk to your health care provider about birth control (contraception). Osteoporosis and menopause  Osteoporosis is a disease in which the bones lose minerals and strength with aging. This can result in serious bone fractures. Your risk for osteoporosis can be identified using a bone density scan.  If you are 14 years of age or older, or if you are at risk for osteoporosis and fractures, ask your health care provider if you should be screened.  Ask your health care provider whether you should take a calcium or vitamin D supplement to lower your risk for osteoporosis.  Menopause may have certain physical symptoms and risks.  Hormone replacement therapy may reduce some of these symptoms and risks. Talk to your health care provider about whether hormone replacement therapy is right for you. Follow these instructions at home:  Schedule regular health, dental, and eye exams.  Stay current with your immunizations.  Do not use any tobacco products including cigarettes, chewing tobacco, or electronic cigarettes.  If you are pregnant, do not drink alcohol.  If you are breastfeeding, limit how much and how often you drink alcohol.  Limit alcohol intake to no more than 1 drink per day for nonpregnant women. One drink equals 12 ounces of beer, 5 ounces of wine, or 1 ounces of hard liquor.  Do not use street drugs.  Do not share needles.  Ask your health care provider for help if you need support or information about quitting drugs.  Tell your health care provider if you often  feel depressed.  Tell your health care provider if you have ever been abused or do not feel safe at home. This information is not intended to replace advice given to you by your health care provider. Make sure you discuss any questions you have with your health care provider. Document Released: 02/28/2011 Document Revised: 01/21/2016 Document Reviewed: 05/19/2015 Elsevier Interactive Patient Education  [employer].

## 2018-03-29 NOTE — Assessment & Plan Note (Signed)
Encouraged weight loss; she will start Weight Watchers; has support from family; check TSH today; she will see nutritionist later

## 2018-03-30 LAB — URINE CYTOLOGY ANCILLARY ONLY: Chlamydia: NEGATIVE

## 2018-04-02 LAB — CYTOLOGY - PAP: Diagnosis: NEGATIVE

## 2018-05-14 ENCOUNTER — Telehealth: Payer: Self-pay

## 2018-05-14 NOTE — Telephone Encounter (Signed)
Left voicemail that Dr. Lada wanted me to call she is trying to close care gaps and would like for you to come in for chlamydia testing by the end of the week.  Please call back if you have any questions for her. 

## 2018-08-14 ENCOUNTER — Other Ambulatory Visit: Payer: Self-pay | Admitting: Family Medicine

## 2018-08-14 NOTE — Telephone Encounter (Signed)
Copied from CRM [phone]. Topic: Quick Communication - Rx Refill/Question >> Aug 14, 2018  3:52 PM Neomia Dearemaray, Efraim KaufmannMelissa wrote: Medication: escitalopram (LEXAPRO) 10 MG tablet   Has the patient contacted their pharmacy? No. (Agent: If no, request that the patient contact the pharmacy for the refill.) (Agent: If yes, when and what did the pharmacy advise?)  Preferred Pharmacy (with phone number or street name): [id number] [place] [place]- [place] - [address] [facility] [address] [phone] (Phone) [phone] (Fax)    Agent: Please be advised that RX refills may take up to 3 business days. We ask that you follow-up with your pharmacy.

## 2018-08-15 NOTE — Telephone Encounter (Signed)
I approved 90+3 in July Please verify pharmacy got that (The note saying 30 days only was an old note)

## 2018-08-15 NOTE — Telephone Encounter (Signed)
CRM for Lexapro refill.   Per Dr. Sherie DonLada I approved 90+3 in July Please verify pharmacy got that (The note saying 30 days only was an old note)  Spoke with pharmacy tech and she stated that the last script of 90 was picked up in October and the patient does still have refills.

## 2018-12-20 ENCOUNTER — Encounter: Payer: Self-pay | Admitting: Family Medicine

## 2019-01-26 ENCOUNTER — Other Ambulatory Visit: Payer: Self-pay | Admitting: Family Medicine

## 2019-04-02 ENCOUNTER — Encounter: Payer: BLUE CROSS/BLUE SHIELD | Admitting: Family Medicine

## 2019-04-30 ENCOUNTER — Other Ambulatory Visit: Payer: Self-pay

## 2019-04-30 ENCOUNTER — Encounter: Payer: Self-pay | Admitting: Family Medicine

## 2019-04-30 ENCOUNTER — Ambulatory Visit (INDEPENDENT_AMBULATORY_CARE_PROVIDER_SITE_OTHER): Payer: BC Managed Care – PPO | Admitting: Family Medicine

## 2019-04-30 DIAGNOSIS — F3341 Major depressive disorder, recurrent, in partial remission: Secondary | ICD-10-CM

## 2019-04-30 DIAGNOSIS — F419 Anxiety disorder, unspecified: Secondary | ICD-10-CM

## 2019-04-30 MED ORDER — HYDROXYZINE HCL 10 MG PO TABS: 10.0000 mg | ORAL_TABLET | Freq: Three times a day (TID) | ORAL | 0 refills | Status: DC | PRN

## 2019-04-30 MED ORDER — ESCITALOPRAM OXALATE 20 MG PO TABS: 20.0000 mg | ORAL_TABLET | Freq: Every day | ORAL | 1 refills | Status: DC

## 2019-04-30 NOTE — Progress Notes (Signed)
Name: Yolanda Gardner   MRN: [id number]    DOB: Dec 21, 1995   Date:04/30/2019       Progress Note  Subjective  Chief Complaint  Chief Complaint  Patient presents with  . Depression  . Medication Refill    I connected with  Yolanda Gardner  on 04/30/19 at  8:40 AM EDT by a video enabled telemedicine application and verified that I am speaking with the correct person using two identifiers.  I discussed the limitations of evaluation and management by telemedicine and the availability of in person appointments. The patient expressed understanding and agreed to proceed. Staff also discussed with the patient that there may be a patient responsible charge related to this service. Patient Location: Home Provider Location: Office Additional Individuals present: None  HPI  Depression:  She has been taking lexapro and doing well; no SI/HI. She is taking 10mg ; tried taking 20mg  on her own and this seemed to work really well for her, so we will provide 20mg  tablets today.  She notes that she did have some episodes of SI prior to increasing to 20mg , she denies any SI and feels a lot less down with the 20mg .  She notes that her anxiety has gone up recently which she attributes to COVID-19 pandemic. She recently got a new job working in [employer]. She has had a few panic attacks - worse with quarantine as she has not been in social situations as often.  She is not currently going to counseling but is interested.   Obesity:  She is trying to exercise 4 times a week - squats, sit ups; sometimes walks the dog.  Her diet has been poor for about a month, but has been trying to make some changes this week - more salads, vegetables, lean meats.   Patient Active Problem List   Diagnosis Date Noted  . Morbid obesity (HCC) 03/29/2018  . Vitamin D deficiency 05/25/2015  . Jaw pain 05/25/2015  . Arm pain, right 05/25/2015  . Depression, major, recurrent (HCC) 04/24/2015  . Preventative health care  04/24/2015    No past surgical history on file.  Family History  Problem Relation Age of Onset  . Depression Mother   . Hypertension Maternal Grandmother   . Diabetes Maternal Aunt     Social History   Socioeconomic History  . Marital status: Single    Spouse name: Not on file  . Number of children: Not on file  . Years of education: Not on file  . Highest education level: Not on file  Occupational History  . Not on file  Social Needs  . Financial resource strain: Not on file  . Food insecurity    Worry: Not on file    Inability: Not on file  . Transportation needs    Medical: Not on file    Non-medical: Not on file  Tobacco Use  . Smoking status: Never Smoker  . Smokeless tobacco: Never Used  Substance and Sexual Activity  . Alcohol use: No  . Drug use: No  . Sexual activity: Not Currently  Lifestyle  . Physical activity    Days per week: Not on file    Minutes per session: Not on file  . Stress: Not on file  Relationships  . Social Musician on phone: Not on file    Gets together: Not on file    Attends religious service: Not on file    Active member of club  or organization: Not on file    Attends meetings of clubs or organizations: Not on file    Relationship status: Not on file  . Intimate partner violence    Fear of current or ex partner: Not on file    Emotionally abused: Not on file    Physically abused: Not on file    Forced sexual activity: Not on file  Other Topics Concern  . Not on file  Social History Narrative  . Not on file     Current Outpatient Medications:  .  Cholecalciferol (VITAMIN D PO), Take by mouth., Disp: , Rfl:  .  escitalopram (LEXAPRO) 10 MG tablet, TAKE 1 TABLET BY MOUTH DAILY, Disp: 90 tablet, Rfl: 0 .  Multiple Vitamins-Minerals (WOMENS MULTIVITAMIN PO), Take by mouth., Disp: , Rfl:  .  naproxen sodium (ANAPROX) 550 MG tablet, Take 550 mg by mouth once as needed., Disp: , Rfl: 0  No Known Allergies  I  personally reviewed active problem list, medication list, allergies, notes from last encounter, lab results with the patient/caregiver today.   ROS  Constitutional: Negative for fever or weight change.  Respiratory: Negative for cough and shortness of breath.   Cardiovascular: Negative for chest pain or palpitations.  Gastrointestinal: Negative for abdominal pain, no bowel changes.  Musculoskeletal: Negative for gait problem or joint swelling.  Skin: Negative for rash.  Neurological: Negative for dizziness or headache.  No other specific complaints in a complete review of systems (except as listed in HPI above).  Objective  Virtual encounter, vitals not obtained.  There is no height or weight on file to calculate BMI.  Physical Exam Constitutional: Patient appears well-developed and well-nourished. No distress.  HENT: Head: Normocephalic and atraumatic.  Neck: Normal range of motion. Pulmonary/Chest: Effort normal. No respiratory distress. Speaking in complete sentences Neurological: Pt is alert and oriented to person, place, and time. Coordination, speech are normal.  Psychiatric: Patient has a normal mood and affect. behavior is normal. Judgment and thought content normal.  No results found for this or any previous visit (from the past 72 hour(s)).  PHQ2/9: Depression screen [facility] 2/9 04/30/2019 03/29/2018 02/26/2018 12/26/2016 11/24/2015  Decreased Interest 1 0 0 0 0  Down, Depressed, Hopeless 1 0 0 0 1  PHQ - 2 Score 2 0 0 0 1  Altered sleeping 1 1 0 - -  Tired, decreased energy 1 1 0 - -  Change in appetite 1 0 0 - -  Feeling bad or failure about yourself  1 0 0 - -  Trouble concentrating 1 0 0 - -  Moving slowly or fidgety/restless 1 0 0 - -  Suicidal thoughts 0 0 0 - -  PHQ-9 Score 8 2 0 - -  Difficult doing work/chores Somewhat difficult - - - -   PHQ-2/9 Result is positive.    Fall Risk: Fall Risk  04/30/2019 03/29/2018 03/29/2018 02/26/2018 12/26/2016  Falls in the past year?  0 No No No No  Number falls in past yr: 0 - - - -  Injury with Fall? 0 - - - -  Follow up Falls evaluation completed - - - -     Assessment & Plan  1. Recurrent major depressive disorder, in partial remission (HCC) - Increase to 20mg ; add on Hydroxyzine PRN for anxiety - escitalopram (LEXAPRO) 20 MG tablet; Take 1 tablet (20 mg total) by mouth daily.  Dispense: 90 tablet; Refill: 1 - hydrOXYzine (ATARAX/VISTARIL) 10 MG tablet; Take 1 tablet (10 mg  total) by mouth 3 (three) times daily as needed.  Dispense: 30 tablet; Refill: 0  2. Morbid obesity (HCC) - Doing well, encouraged more cardio. - Discussed importance of 150 minutes of physical activity weekly, eat two servings of fish weekly, eat one serving of tree nuts ( cashews, pistachios, pecans, almonds.Marland Kitchen.) every other day, eat 6 servings of fruit/vegetables daily and drink plenty of water and avoid sweet beverages.   3. Anxiety - escitalopram (LEXAPRO) 20 MG tablet; Take 1 tablet (20 mg total) by mouth daily.  Dispense: 90 tablet; Refill: 1 - hydrOXYzine (ATARAX/VISTARIL) 10 MG tablet; Take 1 tablet (10 mg total) by mouth 3 (three) times daily as needed.  Dispense: 30 tablet; Refill: 0   I discussed the assessment and treatment plan with the patient. The patient was provided an opportunity to ask questions and all were answered. The patient agreed with the plan and demonstrated an understanding of the instructions.  The patient was advised to call back or seek an in-person evaluation if the symptoms worsen or if the condition fails to improve as anticipated.  I provided 13 minutes of non-face-to-face time during this encounter.

## 2019-10-20 ENCOUNTER — Emergency Department (HOSPITAL_COMMUNITY): Payer: Self-pay

## 2019-10-20 ENCOUNTER — Encounter (HOSPITAL_COMMUNITY): Payer: Self-pay | Admitting: Obstetrics and Gynecology

## 2019-10-20 ENCOUNTER — Other Ambulatory Visit: Payer: Self-pay

## 2019-10-20 ENCOUNTER — Emergency Department (HOSPITAL_COMMUNITY)
Admission: EM | Admit: 2019-10-20 | Discharge: 2019-10-20 | Disposition: A | Discharge status: Home / Self Care | Payer: Self-pay | Attending: Emergency Medicine | Admitting: Emergency Medicine

## 2019-10-20 DIAGNOSIS — F419 Anxiety disorder, unspecified: Secondary | ICD-10-CM | POA: Insufficient documentation

## 2019-10-20 DIAGNOSIS — Z203 Contact with and (suspected) exposure to rabies: Secondary | ICD-10-CM | POA: Insufficient documentation

## 2019-10-20 DIAGNOSIS — S61552A Open bite of left wrist, initial encounter: Secondary | ICD-10-CM | POA: Insufficient documentation

## 2019-10-20 DIAGNOSIS — Y9389 Activity, other specified: Secondary | ICD-10-CM | POA: Insufficient documentation

## 2019-10-20 DIAGNOSIS — Z79899 Other long term (current) drug therapy: Secondary | ICD-10-CM | POA: Insufficient documentation

## 2019-10-20 DIAGNOSIS — W540XXA Bitten by dog, initial encounter: Secondary | ICD-10-CM | POA: Insufficient documentation

## 2019-10-20 DIAGNOSIS — S61452A Open bite of left hand, initial encounter: Secondary | ICD-10-CM | POA: Insufficient documentation

## 2019-10-20 DIAGNOSIS — Z2914 Encounter for prophylactic rabies immune globin: Secondary | ICD-10-CM | POA: Insufficient documentation

## 2019-10-20 DIAGNOSIS — Y999 Unspecified external cause status: Secondary | ICD-10-CM | POA: Insufficient documentation

## 2019-10-20 DIAGNOSIS — Z23 Encounter for immunization: Secondary | ICD-10-CM | POA: Insufficient documentation

## 2019-10-20 DIAGNOSIS — S61213A Laceration without foreign body of left middle finger without damage to nail, initial encounter: Secondary | ICD-10-CM | POA: Insufficient documentation

## 2019-10-20 DIAGNOSIS — Y9289 Other specified places as the place of occurrence of the external cause: Secondary | ICD-10-CM | POA: Insufficient documentation

## 2019-10-20 HISTORY — DX: Depression, unspecified: F32.A

## 2019-10-20 HISTORY — DX: Anxiety disorder, unspecified: F41.9

## 2019-10-20 LAB — POC URINE PREG, ED: Preg Test, Ur: NEGATIVE

## 2019-10-20 MED ORDER — RABIES IMMUNE GLOBULIN 150 UNIT/ML IM INJ
20.0000 [IU]/kg | INJECTION | Freq: Once | INTRAMUSCULAR | Status: AC
  Administered 2019-10-20: 1800 [IU] via INTRAMUSCULAR
  Filled 2019-10-20: qty 12

## 2019-10-20 MED ORDER — AMOXICILLIN-POT CLAVULANATE 875-125 MG PO TABS: 1.0000 | ORAL_TABLET | Freq: Two times a day (BID) | ORAL | 0 refills | Status: AC

## 2019-10-20 MED ORDER — AMOXICILLIN-POT CLAVULANATE 875-125 MG PO TABS: 1.0000 | ORAL_TABLET | Freq: Two times a day (BID) | ORAL | 0 refills | Status: DC

## 2019-10-20 MED ORDER — LIDOCAINE HCL (PF) 1 % IJ SOLN
5.0000 mL | Freq: Once | INTRAMUSCULAR | Status: AC
  Administered 2019-10-20: 5 mL via INTRADERMAL
  Filled 2019-10-20: qty 30

## 2019-10-20 MED ORDER — HYDROCODONE-ACETAMINOPHEN 5-325 MG PO TABS
1.0000 | ORAL_TABLET | Freq: Once | ORAL | Status: AC
  Administered 2019-10-20: 1 via ORAL
  Filled 2019-10-20: qty 1

## 2019-10-20 MED ORDER — RABIES VACCINE, PCEC IM SUSR
1.0000 mL | Freq: Once | INTRAMUSCULAR | Status: AC
  Administered 2019-10-20: 1 mL via INTRAMUSCULAR
  Filled 2019-10-20: qty 1

## 2019-10-20 MED ORDER — TETANUS-DIPHTH-ACELL PERTUSSIS 5-2.5-18.5 LF-MCG/0.5 IM SUSP
0.5000 mL | Freq: Once | INTRAMUSCULAR | Status: AC
  Administered 2019-10-20: 0.5 mL via INTRAMUSCULAR
  Filled 2019-10-20: qty 0.5

## 2019-10-20 NOTE — ED Notes (Signed)
RN debrided wounds and irrigated the area. Blood remnants dabbed off from wounds. Patient's skin is red and irritated with open areas to the hand and wrist

## 2019-10-20 NOTE — ED Provider Notes (Signed)
[facility]-EMERGENCY DEPT Provider Note   CSN: [id number] Arrival date & time: 10/20/19  1542     History Chief Complaint  Patient presents with  . Animal Bite    Yolanda Gardner is a 24 y.o. female.  24 y.o female with a PMH of Anxiety presents to the ED with a chief complaint of dog bite x PTA.  Reports she was at her neighborhood walking her dog when 2 other dogs began to attack her dog, she reports placing her hands a bit and bit by both dogs.  There vaccine status of both of the animals is unknown.  Patient reports she does not know the owner's, she wrapped her hand in gauze after the incident.  She reports pain along the middle phalanx more so at the base where there is a deep laceration present.  She does not know when her last tetanus vaccine was.  She denies any other complaint.  The history is provided by the patient and medical records.       Past Medical History:  Diagnosis Date  . Anxiety   . Depression   . Sinus infection   . Tendonitis     Patient Active Problem List   Diagnosis Date Noted  . Morbid obesity (HCC) 03/29/2018  . Vitamin D deficiency 05/25/2015  . Jaw pain 05/25/2015  . Arm pain, right 05/25/2015  . Depression, major, recurrent (HCC) 04/24/2015    History reviewed. No pertinent surgical history.   OB History   No obstetric history on file.     Family History  Problem Relation Age of Onset  . Depression Mother   . Hypertension Maternal Grandmother   . Diabetes Maternal Aunt     Social History   Tobacco Use  . Smoking status: Never Smoker  . Smokeless tobacco: Never Used  Substance Use Topics  . Alcohol use: Yes    Comment: Social  . Drug use: Yes    Types: Marijuana    Home Medications Prior to Admission medications   Medication Sig Start Date End Date Taking? Authorizing Provider  Cholecalciferol (VITAMIN D PO) Take by mouth.   Yes [provider]  escitalopram (LEXAPRO) 20 MG tablet  Take 1 tablet (20 mg total) by mouth daily. 04/30/19  Yes Doren Custard, FNP  hydrOXYzine (ATARAX/VISTARIL) 10 MG tablet Take 1 tablet (10 mg total) by mouth 3 (three) times daily as needed. 04/30/19  Yes Doren Custard, FNP  Multiple Vitamins-Minerals (WOMENS MULTIVITAMIN PO) Take by mouth.   Yes [provider]  amoxicillin-clavulanate (AUGMENTIN) 875-125 MG tablet Take 1 tablet by mouth 2 (two) times daily for 7 days. 10/20/19 10/27/19  Claude Manges, PA-C    Allergies    Patient has no known allergies.  Review of Systems   Review of Systems  Constitutional: Negative for fever.  Skin: Positive for wound.    Physical Exam Updated Vital Signs BP (!) 135/102   Pulse 83   Temp 98.2 F (36.8 C)   Resp 19   Ht 5\' 5"  (1.651 m)   Wt 89.8 kg   LMP 10/16/2019 (Approximate)   SpO2 100%   BMI 32.95 kg/m   Physical Exam Vitals and nursing note reviewed.  Constitutional:      Appearance: Normal appearance.  HENT:     Head: Normocephalic and atraumatic.     Nose: Nose normal.  Eyes:     Pupils: Pupils are equal, round, and reactive to light.  Cardiovascular:  Rate and Rhythm: Tachycardia present.     Comments: Anxious on arrival Pulmonary:     Effort: Pulmonary effort is normal.  Musculoskeletal:     Left hand: Laceration and bony tenderness present. Decreased range of motion. Normal strength. Decreased sensation. There is no disruption of two-point discrimination. Normal capillary refill. Normal pulse.     Cervical back: Normal range of motion and neck supple.     Comments: Please see photos attached.   Skin:    General: Skin is warm and dry.  Neurological:     Mental Status: She is alert and oriented to person, place, and time.           ED Results / Procedures / Treatments   Labs (all labs ordered are listed, but only abnormal results are displayed) Labs Reviewed  POC URINE PREG, ED    EKG None  Radiology DG Hand Complete Left  Result Date:  10/20/2019 CLINICAL DATA:  24 year old female with trauma to the left hand. EXAM: LEFT HAND - COMPLETE 3+ VIEW COMPARISON:  None. FINDINGS: There is no acute fracture or dislocation. The bones are well mineralized. No arthritic changes. The soft tissues are unremarkable. Dressing noted over the fingers. IMPRESSION: Negative. Electronically Signed   By: Elgie Collard M.D.   On: 10/20/2019 16:29    Procedures .Marland KitchenLaceration Repair  Date/Time: 10/20/2019 5:53 PM Performed by: Claude Manges, PA-C Authorized by: Claude Manges, PA-C   Consent:    Consent obtained:  Verbal   Risks discussed:  Infection, pain, poor cosmetic result, tendon damage and nerve damage   Alternatives discussed:  No treatment Anesthesia (see MAR for exact dosages):    Anesthesia method:  Local infiltration   Local anesthetic:  Lidocaine 1% w/o epi Laceration details:    Location:  Finger   Finger location:  L long finger   Length (cm):  1.5   Depth (mm):  1 Repair type:    Repair type:  Simple Exploration:    Hemostasis achieved with:  Direct pressure   Wound exploration: wound explored through full range of motion     Contaminated: yes   Treatment:    Area cleansed with:  Saline   Amount of cleaning:  Extensive   Irrigation solution:  Sterile saline Skin repair:    Repair method:  Sutures   Suture size:  4-0   Suture material:  Prolene   Suture technique:  Simple interrupted Approximation:    Approximation:  Loose Post-procedure details:    Dressing:  Open (no dressing)   Patient tolerance of procedure:  Tolerated well, no immediate complications   (including critical care time)  Medications Ordered in ED Medications  HYDROcodone-acetaminophen (NORCO/VICODIN) 5-325 MG per tablet 1 tablet (1 tablet Oral Given 10/20/19 1641)  Tdap (BOOSTRIX) injection 0.5 mL (0.5 mLs Intramuscular Given 10/20/19 1639)  rabies immune globulin (HYPERAB/KEDRAB) injection 1,800 Units (1,800 Units Intramuscular Given 10/20/19  1754)  rabies vaccine (RABAVERT) injection 1 mL (1 mL Intramuscular Given 10/20/19 1751)  lidocaine (PF) (XYLOCAINE) 1 % injection 5 mL (5 mLs Intradermal Given 10/20/19 1753)    ED Course  I have reviewed the triage vital signs and the nursing notes.  Pertinent labs & imaging results that were available during my care of the patient were reviewed by me and considered in my medical decision making (see chart for details).    MDM Rules/Calculators/A&P  Patient the past medical history of anxiety presents to the ED status post dog bite by 2 unknown dogs.  Unknown of vaccine status.  She reports pain along the middle digit at the base, this is worse with flexion along with extension.  She has not taken any medication for improvement in her symptoms.  Not know when her last tetanus vaccine was.  Patient has been started on rabies vaccine immunoglobulin therapy, she will need to continue to obtain the rest of the rabies vaccines at urgent care, she is agreeable of this at this time.  We discussed repaired of a dirty wound, due to patient's laceration vulva and joint area without any nerve or tendon damage on my exam and my attending's exam.  I feel that she would need repair to have these loosely closed.  Patient is agreeable to treatment at this time.  Patient had her tetanus vaccine updated, her hand x-ray did not show any fractures, abnormalities.  Have placed 4 sutures to patient's left hand, she tolerated the procedure properly.  She will go home on a short course of Augmentin to help prevent any infection.  Return precautions were discussed at length with patient.  She will also need to obtain vaccine series at urgent care.  Patient understands agrees with management.  Return precautions discussed at length.    Portions of this note were generated with Lobbyist. Dictation errors may occur despite best attempts at proofreading.  Final Clinical Impression(s) / ED Diagnoses Final  diagnoses:  Dog bite, initial encounter  Laceration of left middle finger without foreign body without damage to nail, initial encounter    Rx / DC Orders ED Discharge Orders         Ordered    amoxicillin-clavulanate (AUGMENTIN) 875-125 MG tablet  2 times daily     10/20/19 1717           Janeece Fitting, PA-C 10/20/19 1755    Maudie Flakes, MD 10/21/19 [phone]

## 2019-10-20 NOTE — ED Triage Notes (Signed)
PT reports she was bitten by 2 unknown dogs trying to protect her dogs

## 2019-10-20 NOTE — ED Notes (Signed)
Patient does not know the dogs or the owner of the dogs, patient instructed if she finds out any pertinent info to notify police and animal control

## 2019-10-20 NOTE — Discharge Instructions (Addendum)
I have placed 4 sutures to your left hand you will need to have these removed within 7-10 days.   I have written a prescription for antibiotics in order to prevent infection to your left hand, please take 1 tablet twice a day for the next 7 days.  You experience any drainage from the wound, redness, worsening pain or symptoms you may return to the emergency department.

## 2019-10-23 ENCOUNTER — Other Ambulatory Visit: Payer: Self-pay

## 2019-10-23 ENCOUNTER — Emergency Department (HOSPITAL_COMMUNITY)
Admission: EM | Admit: 2019-10-23 | Discharge: 2019-10-24 | Disposition: A | Discharge status: Home / Self Care | Payer: Self-pay | Attending: Emergency Medicine | Admitting: Emergency Medicine

## 2019-10-23 ENCOUNTER — Encounter (HOSPITAL_COMMUNITY): Payer: Self-pay

## 2019-10-23 ENCOUNTER — Encounter (HOSPITAL_COMMUNITY): Payer: Self-pay | Admitting: Emergency Medicine

## 2019-10-23 ENCOUNTER — Ambulatory Visit (HOSPITAL_COMMUNITY)
Admission: EM | Admit: 2019-10-23 | Discharge: 2019-10-23 | Disposition: A | Discharge status: Home / Self Care | Payer: Self-pay | Attending: Family Medicine | Admitting: Family Medicine

## 2019-10-23 ENCOUNTER — Emergency Department (HOSPITAL_COMMUNITY): Payer: Self-pay

## 2019-10-23 DIAGNOSIS — R000 Tachycardia, unspecified: Secondary | ICD-10-CM | POA: Insufficient documentation

## 2019-10-23 DIAGNOSIS — Z20822 Contact with and (suspected) exposure to covid-19: Secondary | ICD-10-CM | POA: Insufficient documentation

## 2019-10-23 DIAGNOSIS — Z23 Encounter for immunization: Secondary | ICD-10-CM

## 2019-10-23 DIAGNOSIS — R0602 Shortness of breath: Secondary | ICD-10-CM | POA: Insufficient documentation

## 2019-10-23 DIAGNOSIS — E860 Dehydration: Secondary | ICD-10-CM | POA: Insufficient documentation

## 2019-10-23 DIAGNOSIS — R1013 Epigastric pain: Secondary | ICD-10-CM | POA: Insufficient documentation

## 2019-10-23 DIAGNOSIS — R55 Syncope and collapse: Secondary | ICD-10-CM | POA: Insufficient documentation

## 2019-10-23 DIAGNOSIS — R112 Nausea with vomiting, unspecified: Secondary | ICD-10-CM | POA: Insufficient documentation

## 2019-10-23 DIAGNOSIS — Z79899 Other long term (current) drug therapy: Secondary | ICD-10-CM | POA: Insufficient documentation

## 2019-10-23 DIAGNOSIS — I959 Hypotension, unspecified: Secondary | ICD-10-CM | POA: Insufficient documentation

## 2019-10-23 LAB — COMPREHENSIVE METABOLIC PANEL
ALT: 22 U/L (ref 0–44)
AST: 22 U/L (ref 15–41)
Albumin: 4.3 g/dL (ref 3.5–5.0)
Alkaline Phosphatase: 52 U/L (ref 38–126)
Anion gap: 17 — ABNORMAL HIGH (ref 5–15)
BUN: 8 mg/dL (ref 6–20)
CO2: 14 mmol/L — ABNORMAL LOW (ref 22–32)
Calcium: 9.6 mg/dL (ref 8.9–10.3)
Chloride: 109 mmol/L (ref 98–111)
Creatinine, Ser: 0.63 mg/dL (ref 0.44–1.00)
GFR calc Af Amer: >60 mL/min (ref >60)
GFR calc non Af Amer: >60 mL/min (ref >60)
Glucose, Bld: 150 mg/dL — ABNORMAL HIGH (ref 70–99)
Potassium: 3.5 mmol/L (ref 3.5–5.1)
Sodium: 140 mmol/L (ref 135–145)
Total Bilirubin: 0.7 mg/dL (ref 0.3–1.2)
Total Protein: 7.3 g/dL (ref 6.5–8.1)

## 2019-10-23 LAB — CBC
HCT: 41.6 % (ref 36.0–46.0)
Hemoglobin: 14.4 g/dL (ref 12.0–15.0)
MCH: 28.9 pg (ref 26.0–34.0)
MCHC: 34.6 g/dL (ref 30.0–36.0)
MCV: 83.5 fL (ref 80.0–100.0)
Platelets: 397 10*3/uL (ref 150–400)
RBC: 4.98 MIL/uL (ref 3.87–5.11)
RDW: 12.3 % (ref 11.5–15.5)
WBC: 12.4 10*3/uL — ABNORMAL HIGH (ref 4.0–10.5)
nRBC: 0 % (ref 0.0–0.2)

## 2019-10-23 LAB — URINALYSIS, ROUTINE W REFLEX MICROSCOPIC
Bilirubin Urine: NEGATIVE
Glucose, UA: NEGATIVE mg/dL
Hgb urine dipstick: NEGATIVE
Ketones, ur: 80 mg/dL — AB
Leukocytes,Ua: NEGATIVE
Nitrite: NEGATIVE
Protein, ur: 30 mg/dL — AB
Specific Gravity, Urine: 1.028 (ref 1.005–1.030)
pH: 5 (ref 5.0–8.0)

## 2019-10-23 LAB — LIPASE, BLOOD: Lipase: 24 U/L (ref 11–51)

## 2019-10-23 LAB — I-STAT BETA HCG BLOOD, ED (MC, WL, AP ONLY): I-stat hCG, quantitative: <5 m[IU]/mL (ref <5)

## 2019-10-23 MED ORDER — ONDANSETRON 4 MG PO TBDP
ORAL_TABLET | ORAL | Status: AC
  Filled 2019-10-23: qty 1

## 2019-10-23 MED ORDER — SODIUM CHLORIDE 0.9 % IV BOLUS
1000.0000 mL | Freq: Once | INTRAVENOUS | Status: AC
  Administered 2019-10-23: 1000 mL via INTRAVENOUS

## 2019-10-23 MED ORDER — ONDANSETRON 4 MG PO TBDP
4.0000 mg | ORAL_TABLET | Freq: Once | ORAL | Status: AC
  Administered 2019-10-23: 4 mg via ORAL

## 2019-10-23 MED ORDER — IOHEXOL 350 MG/ML SOLN
100.0000 mL | Freq: Once | INTRAVENOUS | Status: AC | PRN
  Administered 2019-10-23: 100 mL via INTRAVENOUS

## 2019-10-23 MED ORDER — ALUM & MAG HYDROXIDE-SIMETH 200-200-20 MG/5ML PO SUSP
ORAL | Status: AC
  Filled 2019-10-23: qty 30

## 2019-10-23 MED ORDER — PROCHLORPERAZINE EDISYLATE 10 MG/2ML IJ SOLN
10.0000 mg | Freq: Once | INTRAMUSCULAR | Status: AC
  Administered 2019-10-23: 10 mg via INTRAVENOUS
  Filled 2019-10-23: qty 2

## 2019-10-23 MED ORDER — LIDOCAINE VISCOUS HCL 2 % MT SOLN: 15.0000 mL | Freq: Once | OROMUCOSAL | Status: DC

## 2019-10-23 MED ORDER — ALUM & MAG HYDROXIDE-SIMETH 200-200-20 MG/5ML PO SUSP
30.0000 mL | Freq: Once | ORAL | Status: AC
  Administered 2019-10-23: 30 mL via ORAL

## 2019-10-23 MED ORDER — ALUM & MAG HYDROXIDE-SIMETH 200-200-20 MG/5ML PO SUSP: 30.0000 mL | Freq: Once | ORAL | Status: DC

## 2019-10-23 MED ORDER — FAMOTIDINE IN NACL 20-0.9 MG/50ML-% IV SOLN
20.0000 mg | Freq: Once | INTRAVENOUS | Status: AC
  Administered 2019-10-23: 20 mg via INTRAVENOUS
  Filled 2019-10-23: qty 50

## 2019-10-23 MED ORDER — SODIUM CHLORIDE 0.9% FLUSH
3.0000 mL | Freq: Once | INTRAVENOUS | Status: AC
  Administered 2019-10-23: 3 mL via INTRAVENOUS

## 2019-10-23 MED ORDER — RABIES VACCINE, PCEC IM SUSR
INTRAMUSCULAR | Status: AC
  Filled 2019-10-23: qty 1

## 2019-10-23 NOTE — Discharge Instructions (Addendum)
Report to the Emergency Department for further evaluation and treatment.

## 2019-10-23 NOTE — ED Notes (Signed)
Patient vomiting.  MD notified.  See orders and [facility]

## 2019-10-23 NOTE — ED Notes (Signed)
Notified [facility] of transport

## 2019-10-23 NOTE — ED Notes (Signed)
Patient is being discharged from the Urgent Care Center and sent to the Emergency Department via wheelchair by staff. Per stephanie np, patient is stable but in need of higher level of care due to orthostatic. Patient is aware and verbalizes understanding of plan of care.  Vitals:   10/23/19 1514  BP: 131/61  Pulse: 78  Resp: (!) 24  Temp: 97.7 F (36.5 C)  SpO2: 100%

## 2019-10-23 NOTE — ED Triage Notes (Addendum)
Pt arrives to ED from UC with [facility] with complaints of epigastric abdominal pain, dizziness, and generalized body aches. Patient states she went to UC today for the abdominal pain and her 2nd rabies vaccine but they sent her here. Patient states she feels like she is going to pass out. Patient states she is having really bad anxiety and this feels like one of her anxiety attacks.

## 2019-10-23 NOTE — ED Provider Notes (Signed)
Plan to f/u on CT imaging and repeat BMP If improved, she can be discharged    Zadie Rhine, MD 10/23/19 2329

## 2019-10-23 NOTE — ED Provider Notes (Signed)
[facility]    CSN: [id number] Arrival date & time: 10/23/19  1432      History   Chief Complaint Chief Complaint  Patient presents with  . Rabies Injection  . Shortness of Breath    HPI Yolanda Gardner is a 24 y.o. female.   Reports vomiting with abd pain since for about 8 hours. Reports that she is dizzy, nauseated, achy, and feels like she is going to pass out. These symptoms just started about 3 hours ago. Denies fever, sick contacts. Reports that she is here for a rabies vaccine as she was bitten by a dog recently. She began amoxicillin for the bite on 10/20/19. Has not taken her dose today.  Reports that she has passed out multiple times today, reports abd burning as well.    The history is provided by the patient and a friend.    Past Medical History:  Diagnosis Date  . Anxiety   . Depression   . Sinus infection   . Tendonitis     Patient Active Problem List   Diagnosis Date Noted  . Morbid obesity (HCC) 03/29/2018  . Vitamin D deficiency 05/25/2015  . Jaw pain 05/25/2015  . Arm pain, right 05/25/2015  . Depression, major, recurrent (HCC) 04/24/2015    History reviewed. No pertinent surgical history.  OB History   No obstetric history on file.      Home Medications    Prior to Admission medications   Medication Sig Start Date End Date Taking? Authorizing Provider  amoxicillin-clavulanate (AUGMENTIN) 875-125 MG tablet Take 1 tablet by mouth 2 (two) times daily for 7 days. 10/20/19 10/27/19  Sabas Sous, MD  Cholecalciferol (VITAMIN D PO) Take by mouth.    [provider]  escitalopram (LEXAPRO) 20 MG tablet Take 1 tablet (20 mg total) by mouth daily. 04/30/19   Doren Custard, FNP  hydrOXYzine (ATARAX/VISTARIL) 10 MG tablet Take 1 tablet (10 mg total) by mouth 3 (three) times daily as needed. 04/30/19   Doren Custard, FNP  Multiple Vitamins-Minerals (WOMENS MULTIVITAMIN PO) Take by mouth.    [provider]    Family  History Family History  Problem Relation Age of Onset  . Depression Mother   . Hypertension Maternal Grandmother   . Diabetes Maternal Aunt     Social History Social History   Tobacco Use  . Smoking status: Never Smoker  . Smokeless tobacco: Never Used  Substance Use Topics  . Alcohol use: Yes    Comment: Social  . Drug use: Yes    Types: Marijuana     Allergies   Patient has no known allergies.   Review of Systems Review of Systems  Constitutional: Positive for appetite change, chills and diaphoresis. Negative for fever.  HENT: Negative for ear pain, facial swelling, sinus pressure, sinus pain, sore throat and trouble swallowing.   Eyes: Negative for pain and visual disturbance.  Respiratory: Positive for shortness of breath. Negative for cough.   Cardiovascular: Negative for chest pain and palpitations.  Gastrointestinal: Positive for nausea and vomiting. Negative for abdominal distention and abdominal pain.  Genitourinary: Negative for dysuria and hematuria.  Musculoskeletal: Negative for arthralgias and back pain.  Skin: Negative for color change and rash.  Neurological: Positive for dizziness, syncope, light-headedness, numbness and headaches. Negative for seizures.  Psychiatric/Behavioral: The patient is nervous/anxious.   All other systems reviewed and are negative.    Physical Exam Triage Vital Signs ED Triage Vitals  Enc Vitals  Group     BP      Pulse      Resp      Temp      Temp src      SpO2      Weight      Height      Head Circumference      Peak Flow      Pain Score      Pain Loc      Pain Edu?      Excl. in GC?    Orthostatic VS for the past 24 hrs:  BP- Lying Pulse- Lying BP- Sitting Pulse- Sitting BP- Standing at 0 minutes Pulse- Standing at 0 minutes  10/23/19 1532 125/68 57 116/67 74 (!) 85/35 97    Updated Vital Signs BP 131/61 (BP Location: Left Arm)   Pulse 78   Temp 97.7 F (36.5 C) (Oral)   Resp (!) 24   LMP 10/16/2019  (Approximate)   SpO2 100%      Physical Exam Vitals and nursing note reviewed.  Constitutional:      General: She is in acute distress.     Appearance: She is well-developed. She is ill-appearing.  HENT:     Head: Normocephalic and atraumatic.  Eyes:     Conjunctiva/sclera: Conjunctivae normal.  Cardiovascular:     Rate and Rhythm: Regular rhythm. Tachycardia present.     Heart sounds: Normal heart sounds. No murmur.  Pulmonary:     Effort: Pulmonary effort is normal. No respiratory distress.     Breath sounds: Normal breath sounds.  Abdominal:     General: Bowel sounds are normal.     Palpations: Abdomen is soft.     Tenderness: There is abdominal tenderness.     Comments: Epigastric tenderness   Musculoskeletal:     Cervical back: Neck supple.  Skin:    General: Skin is warm and dry.     Capillary Refill: Capillary refill takes less than 2 seconds.  Neurological:     Mental Status: She is alert.      UC Treatments / Results  Labs (all labs ordered are listed, but only abnormal results are displayed) Labs Reviewed  NOVEL CORONAVIRUS, NAA (HOSP ORDER, SEND-OUT TO REF LAB; TAT 18-24 HRS)    EKG   Radiology No results found.  Procedures Procedures (including critical care time)  Medications Ordered in UC Medications  alum & mag hydroxide-simeth (MAALOX/MYLANTA) 200-200-20 MG/5ML suspension 30 mL (30 mLs Oral Given 10/23/19 1527)  ondansetron (ZOFRAN-ODT) disintegrating tablet 4 mg (4 mg Oral Given 10/23/19 1528)    Initial Impression / Assessment and Plan / UC Course  I have reviewed the triage vital signs and the nursing notes.  Pertinent labs & imaging results that were available during my care of the patient were reviewed by me and considered in my medical decision making (see chart for details).    Appears lethargic, pale, with tachycardia and tachypnea. Vomiting in office. Given zofran sublingual, and oral GI cocktail, patient vomited about 5 mins  after taking these. Observed pt losing consciousness for about 6 seconds during orthostatic vitals. She was significantly hypotensive upon standing, going from 116/67 sitting to 85/35 standing. Pulse has ranged from 57-150 during exam. Sent patient to ER via EMS for further evaluation and treatment.  Final Clinical Impressions(s) / UC Diagnoses   Final diagnoses:  Syncope, unspecified syncope type  Hypotension, unspecified hypotension type  Epigastric pain  SOB (shortness of breath)  Tachycardia  Discharge Instructions     Report to the Emergency Department for further evaluation and treatment.    ED Prescriptions    None     I have reviewed the PDMP during this encounter.   Faustino Congress, NP 10/23/19 1607

## 2019-10-23 NOTE — ED Provider Notes (Signed)
[facility]-EM[facility]ENCY DEPT [facility] Emergency Department Provider Note MRN:  [id number]  Arrival date & time: 10/23/19     Chief Complaint   Abdominal Pain and Emesis   History of Present Illness   Yolanda Gardner is a 24 y.o. year-old female with no pertinent past medical presenting to the ED with chief complaint of abdominal pain and emesis.  Patient explains that she has been taking antibiotics for a dog bite for the past 3 days.  She took her antibiotic pill on an empty stomach earlier today, and soon afterwards began experiencing epigastric pain, nausea, vomiting.  Nonbloody nonbilious.  5 episodes.  No diarrhea, no lower abdominal pain, no vaginal bleeding or discharge, no fever.  Discomfort is moderate in severity, constant, no exacerbating or alleviating factors.  Review of Systems  A complete 10 system review of systems was obtained and all systems are negative except as noted in the HPI and PMH.   Patient's Health History    Past Medical History:  Diagnosis Date  . Anxiety   . Depression   . Sinus infection   . Tendonitis     History reviewed. No pertinent surgical history.  Family History  Problem Relation Age of Onset  . Depression Mother   . Hypertension Maternal Grandmother   . Diabetes Maternal Aunt     Social History   Socioeconomic History  . Marital status: Single    Spouse name: Not on file  . Number of children: Not on file  . Years of education: Not on file  . Highest education level: Not on file  Occupational History  . Not on file  Tobacco Use  . Smoking status: Never Smoker  . Smokeless tobacco: Never Used  Substance and Sexual Activity  . Alcohol use: Yes    Comment: Social  . Drug use: Yes    Types: Marijuana  . Sexual activity: Not Currently  Other Topics Concern  . Not on file  Social History Narrative  . Not on file   Social Determinants of Health   Financial Resource Strain:   . Difficulty of Paying Living Expenses: Not on  file  Food Insecurity:   . Worried About Programme researcher, broadcasting/film/video in the Last Year: Not on file  . Ran Out of Food in the Last Year: Not on file  Transportation Needs:   . Lack of Transportation (Medical): Not on file  . Lack of Transportation (Non-Medical): Not on file  Physical Activity:   . Days of Exercise per Week: Not on file  . Minutes of Exercise per Session: Not on file  Stress:   . Feeling of Stress : Not on file  Social Connections:   . Frequency of Communication with Friends and Family: Not on file  . Frequency of Social Gatherings with Friends and Family: Not on file  . Attends Religious Services: Not on file  . Active Member of Clubs or Organizations: Not on file  . Attends Banker Meetings: Not on file  . Marital Status: Not on file  Intimate Partner Violence:   . Fear of Current or Ex-Partner: Not on file  . Emotionally Abused: Not on file  . Physically Abused: Not on file  . Sexually Abused: Not on file     Physical Exam   Vitals:   10/23/19 2115 10/23/19 2221  BP: 120/69 (!) 125/99  Pulse: 63 88  Resp: 17 (!) 28  Temp:    SpO2: 98% 98%    CONSTITUTIONAL:  Well-appearing, NAD NEURO:  Alert and oriented x 3, no focal deficits EYES:  eyes equal and reactive ENT/NECK:  no LAD, no JVD CARDIO: Regular rate, well-perfused, normal S1 and S2 PULM:  CTAB no wheezing or rhonchi GI/GU:  normal bowel sounds, non-distended, non-tender MSK/SPINE:  No gross deformities, no edema SKIN:  no rash, atraumatic PSYCH:  Appropriate speech and behavior  *Additional and/or pertinent findings included in MDM below  Diagnostic and Interventional Summary    EKG Interpretation  Date/Time:    Ventricular Rate:    PR Interval:    QRS Duration:   QT Interval:    QTC Calculation:   R Axis:     Text Interpretation:        Cardiac Monitoring Interpretation:  Labs Reviewed  COMPREHENSIVE METABOLIC PANEL - Abnormal; Notable for the following components:       Result Value   CO2 14 (*)    Glucose, Bld 150 (*)    Anion gap 17 (*)    All other components within normal limits  CBC - Abnormal; Notable for the following components:   WBC 12.4 (*)    All other components within normal limits  URINALYSIS, ROUTINE W REFLEX MICROSCOPIC - Abnormal; Notable for the following components:   APPearance TURBID (*)    Ketones, ur 80 (*)    Protein, ur 30 (*)    Bacteria, UA RARE (*)    All other components within normal limits  LIPASE, BLOOD  BASIC METABOLIC PANEL  I-STAT BETA HCG BLOOD, ED ([facility], [facility], [facility] ONLY)    CT ABDOMEN PELVIS W CONTRAST    (Results Pending)    Medications  sodium chloride 0.9 % bolus 1,000 mL (has no administration in time range)  famotidine (PEPCID) IVPB 20 mg premix (20 mg Intravenous New Bag/Given 10/23/19 2215)  sodium chloride flush (NS) 0.9 % injection 3 mL (3 mLs Intravenous Given 10/23/19 2203)  sodium chloride 0.9 % bolus 1,000 mL (1,000 mLs Intravenous New Bag/Given 10/23/19 2204)  prochlorperazine (COMPAZINE) injection 10 mg (10 mg Intravenous Given 10/23/19 2203)     Procedures  /  Critical Care Procedures  ED Course and Medical Decision Making  I have reviewed the triage vital signs, the nursing notes, and pertinent available records from the EMR.  Pertinent labs & imaging results that were available during my care of the patient were reviewed by me and considered in my medical decision making (see below for details).     Persistent vomiting, question gastritis versus antibiotic intolerance.  Patient is significantly acidotic, seems to be due to dehydration.  Continued vomiting here in the emergency department, will obtain CT to exclude obstructive process.  Patient continues to look really well, hoping to avoid admission, will try symptomatic management, rehydration and recheck BMP.  Signed out to oncoming provider at shift change.    Barth Kirks. Sedonia Small, [place] [email or web address]  Final Clinical Impressions(s) / ED Diagnoses     ICD-10-CM   1. Nausea and vomiting, intractability of vomiting not specified, unspecified vomiting type  R11.2   2. Epigastric pain  R10.13   3. Dehydration  E86.0     ED Discharge Orders    None       Discharge Instructions Discussed with and Provided to Patient:   Discharge Instructions   None       Maudie Flakes, MD 10/23/19 2234

## 2019-10-23 NOTE — ED Notes (Signed)
Patient RR high and patient is anxious.  "I think I am having a panic attack."  Educated on breathing patterns.  Patient RR decreasing, feeling better

## 2019-10-23 NOTE — ED Triage Notes (Signed)
Pt is here for her 2nd rabies vaccine.  Pt is also experiencing SOB, vomiting, sweats, constant rapid heart rate since this morning. States she hasnt eaten anything & theres a burning sensation in her stomach. While observing her she has passed out four times during triage, she is quickly responsive after.

## 2019-10-24 LAB — BASIC METABOLIC PANEL
Anion gap: 11 (ref 5–15)
BUN: 7 mg/dL (ref 6–20)
CO2: 19 mmol/L — ABNORMAL LOW (ref 22–32)
Calcium: 8.6 mg/dL — ABNORMAL LOW (ref 8.9–10.3)
Chloride: 109 mmol/L (ref 98–111)
Creatinine, Ser: 0.64 mg/dL (ref 0.44–1.00)
GFR calc Af Amer: >60 mL/min (ref >60)
GFR calc non Af Amer: >60 mL/min (ref >60)
Glucose, Bld: 128 mg/dL — ABNORMAL HIGH (ref 70–99)
Potassium: 3.4 mmol/L — ABNORMAL LOW (ref 3.5–5.1)
Sodium: 139 mmol/L (ref 135–145)

## 2019-10-24 NOTE — ED Provider Notes (Signed)
Dehydration improved.  No further vomiting.  She feels improved.  Heart rate will increase when I am in the room and then decreased down to the low 100s.   EKG Interpretation  Date/Time:  Thursday October 24 2019 00:17:06 EST Ventricular Rate:  113 PR Interval:    QRS Duration: 82 QT Interval:  383 QTC Calculation: 526 R Axis:   46 Text Interpretation: Sinus tachycardia Borderline T abnormalities, diffuse leads Prolonged QT interval No previous ECGs available Confirmed by Zadie Rhine ([id number]) on 10/24/2019 12:36:59 AM      Patient does have a prolonged QT on EKG.  She is on Lexapro Advised that she may need to cut back on this or eventually stop it due to prolonged QT.  She will call her PCP this week about inquiring cutting back or changing medication, but I advised her not to abruptly stop it  Informed CT showed signs of mild colitis, she does report having some diarrhea.  This will likely resolve.  She is nontoxic and well-appearing BP 122/71   Pulse 98   Temp 98.5 F (36.9 C) (Oral)   Resp 20   LMP 10/16/2019 (Approximate)   SpO2 95%     Zadie Rhine, MD 10/24/19 [phone]

## 2019-10-24 NOTE — Discharge Instructions (Addendum)
Please discuss with your doctor about taking Lexapro with a high heart rate and  the prolonged QT as we discussed

## 2019-10-25 LAB — NOVEL CORONAVIRUS, NAA (HOSP ORDER, SEND-OUT TO REF LAB; TAT 18-24 HRS): SARS-CoV-2, NAA: NOT DETECTED

## 2019-10-27 ENCOUNTER — Ambulatory Visit (HOSPITAL_COMMUNITY)
Admission: EM | Admit: 2019-10-27 | Discharge: 2019-10-27 | Disposition: A | Discharge status: Home / Self Care | Payer: Self-pay | Attending: Family Medicine | Admitting: Family Medicine

## 2019-10-27 ENCOUNTER — Other Ambulatory Visit: Payer: Self-pay

## 2019-10-27 DIAGNOSIS — Z23 Encounter for immunization: Secondary | ICD-10-CM

## 2019-10-27 DIAGNOSIS — Z203 Contact with and (suspected) exposure to rabies: Secondary | ICD-10-CM

## 2019-10-27 MED ORDER — RABIES VACCINE, PCEC IM SUSR
INTRAMUSCULAR | Status: AC
  Filled 2019-10-27: qty 1

## 2019-10-27 MED ORDER — RABIES VACCINE, PCEC IM SUSR: 1.0000 mL | Freq: Once | INTRAMUSCULAR | Status: DC

## 2019-10-27 NOTE — ED Triage Notes (Signed)
Pt is here getting her 3rd rabies vaccine.

## 2019-11-02 ENCOUNTER — Ambulatory Visit: Payer: Self-pay

## 2019-11-03 ENCOUNTER — Ambulatory Visit (HOSPITAL_COMMUNITY)
Admission: EM | Admit: 2019-11-03 | Discharge: 2019-11-03 | Disposition: A | Discharge status: Home / Self Care | Payer: Self-pay | Attending: Internal Medicine | Admitting: Internal Medicine

## 2019-11-03 ENCOUNTER — Encounter (HOSPITAL_COMMUNITY): Payer: Self-pay | Admitting: Emergency Medicine

## 2019-11-03 ENCOUNTER — Other Ambulatory Visit: Payer: Self-pay

## 2019-11-03 DIAGNOSIS — Z203 Contact with and (suspected) exposure to rabies: Secondary | ICD-10-CM

## 2019-11-03 DIAGNOSIS — Z23 Encounter for immunization: Secondary | ICD-10-CM

## 2019-11-03 MED ORDER — RABIES VACCINE, PCEC IM SUSR
INTRAMUSCULAR | Status: AC
  Filled 2019-11-03: qty 1

## 2019-11-03 MED ORDER — RABIES VACCINE, PCEC IM SUSR
1.0000 mL | Freq: Once | INTRAMUSCULAR | Status: AC
  Administered 2019-11-03: 1 mL via INTRAMUSCULAR

## 2019-11-03 NOTE — ED Triage Notes (Signed)
Pt here for 4th rabies injection; given in right arm 

## 2019-11-18 ENCOUNTER — Ambulatory Visit: Payer: Self-pay | Attending: Internal Medicine

## 2019-11-18 DIAGNOSIS — Z23 Encounter for immunization: Secondary | ICD-10-CM

## 2019-11-18 NOTE — Progress Notes (Signed)
   Covid-19 Vaccination Clinic  Name:  Yolanda Gardner    MRN: [id number] DOB: 1996-03-14  11/18/2019  [name] was observed post Covid-19 immunization for 15 minutes without incident. She was provided with Vaccine Information Sheet and instruction to access the V-Safe system.   [name] was instructed to call 911 with any severe reactions post vaccine: Marland Kitchen Difficulty breathing  . Swelling of face and throat  . A fast heartbeat  . A bad rash all over body  . Dizziness and weakness   Immunizations Administered    Name Date Dose VIS Date Route   Pfizer COVID-19 Vaccine 11/18/2019 10:08 AM 0.3 mL 08/09/2019 Intramuscular   Manufacturer: [employer], [employer]   Lot: [id number]   NDC: [address]-9

## 2019-12-11 ENCOUNTER — Ambulatory Visit: Payer: Self-pay | Attending: Internal Medicine

## 2019-12-11 DIAGNOSIS — Z23 Encounter for immunization: Secondary | ICD-10-CM

## 2019-12-11 NOTE — Progress Notes (Signed)
   Covid-19 Vaccination Clinic  Name:  Yolanda Gardner    MRN: [id number] DOB: 10/17/1995  12/11/2019  [name] was observed post Covid-19 immunization for 15 minutes without incident. She was provided with Vaccine Information Sheet and instruction to access the V-Safe system.   [name] was instructed to call 911 with any severe reactions post vaccine: Marland Kitchen Difficulty breathing  . Swelling of face and throat  . A fast heartbeat  . A bad rash all over body  . Dizziness and weakness   Immunizations Administered    Name Date Dose VIS Date Route   Pfizer COVID-19 Vaccine 12/11/2019 11:04 AM 0.3 mL 08/09/2019 Intramuscular   Manufacturer: [employer], [employer]   Lot: [id number]   NDC: [address]-3

## 2019-12-31 NOTE — Progress Notes (Signed)
Patient ID: Yolanda Gardner, female    DOB: 1995-10-21, 24 y.o.   MRN: [id number]  PCP: Jamelle Haring, MD  Chief Complaint  Patient presents with  . Depression    follow up, medication refills, taking 1/2 of tablet due to being out of medication  . Anxiety    Subjective:   CHANEQUA SPEES is a 24 y.o. female, presents to clinic with CC of the following:  Chief Complaint  Patient presents with  . Depression    follow up, medication refills, taking 1/2 of tablet due to being out of medication  . Anxiety    HPI:  Patient is a 24 year old female patient of Maurice Small At last visit was a telemedicine visit in September 2020. She follows up today for a medication refill  Depression/Anxiety:  She has been taking lexapro and doing well;  Medication regiment - lexapro 20mg ; hydroxyzine 10 mg prn for anxiety and currently not have any, was helpful when had She notes that she did have some episodes of SI prior to increasing to 20mg , she denies any thoughts of hurting self or others in last months, she thinks medications have been very helpful  She notes that her anxiety increase was a lot due to COVID-19 pandemic in general. 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 She has had a few panic attacks in her past (since started college), 2 -3 in past 6 months and often a precipitant.  She is not currently going to counseling, just got health insurance this past week and is going to look into counseling that will be covered with her insurance and encouraged that.  Denies any CP, SOB, rare palp's noted and none in the recent months, went to ER with N/V and remains better since. Off prilosec and still good, no black stools, no BRBPR, no dysuria.   She was seen in the emergency room 10/23/2019 with nausea and vomiting. An ECG done at that time was noted to have a prolonged QTc, and it was advised that  she may need to cut back on the Lexapro and eventually stop it due to this.  It was recommended she call her PCP who was at that time to inquire about cutting back on Lexapro or changing the medication.  She has not had any follow-up since.  Obesity:  She is trying to exercise almost daily, often walks the dog.   She noted she lost some weight with the stomach symptoms she had that she went to the emergency room for, and has been trying to watch her diet both in quality and quantity. Wt Readings from Last 3 Encounters:  01/01/20 207 lb 4.8 oz (94 kg)  10/20/19 198 lb (89.8 kg)  03/29/18 241 lb 1.6 oz (109.4 kg)     No tob use, alcohol denied since recent ER visit, not heavy prior  Patient Active Problem List   Diagnosis Date Noted  . Generalized anxiety disorder with panic attacks 01/01/2020  . Morbid obesity (HCC) 03/29/2018  . Vitamin D deficiency 05/25/2015  . Jaw pain 05/25/2015  . Arm pain, right 05/25/2015  . Depression, major, recurrent (HCC) 04/24/2015      Current Outpatient Medications:  .  Cholecalciferol (VITAMIN D PO), Take by mouth., Disp: , Rfl:  .  escitalopram (LEXAPRO) 20 MG tablet, Take 1 tablet (20 mg total) by mouth daily., Disp: 90 tablet,  Rfl: 1 .  hydrOXYzine (ATARAX/VISTARIL) 10 MG tablet, Take 1 tablet (10 mg total) by mouth 3 (three) times daily as needed., Disp: 30 tablet, Rfl: 0 .  Multiple Vitamins-Minerals (WOMENS MULTIVITAMIN PO), Take by mouth., Disp: , Rfl:    No Known Allergies   No past surgical history on file.   Family History  Problem Relation Age of Onset  . Depression Mother   . Hypertension Maternal Grandmother   . Diabetes Maternal Aunt      Social History   Tobacco Use  . Smoking status: Never Smoker  . Smokeless tobacco: Never Used  Substance Use Topics  . Alcohol use: Yes    Comment: Social    With staff assistance, above reviewed with the patient today.  ROS: As per HPI, otherwise no specific  complaints on a limited and focused system review   No results found for this or any previous visit (from the past 72 hour(s)).   PHQ2/9: Depression screen [facility] 2/9 01/01/2020 04/30/2019 03/29/2018 02/26/2018 12/26/2016  Decreased Interest 1 1 0 0 0  Down, Depressed, Hopeless 1 1 0 0 0  PHQ - 2 Score 2 2 0 0 0  Altered sleeping 1 1 1  0 -  Tired, decreased energy 1 1 1  0 -  Change in appetite 0 1 0 0 -  Feeling bad or failure about yourself  0 1 0 0 -  Trouble concentrating 1 1 0 0 -  Moving slowly or fidgety/restless 1 1 0 0 -  Suicidal thoughts 0 0 0 0 -  PHQ-9 Score 6 8 2  0 -  Difficult doing work/chores Somewhat difficult Somewhat difficult - - -   PHQ-2/9 Result reviewed  GAD 7 : Generalized Anxiety Score 01/01/2020 02/26/2018  Nervous, Anxious, on Edge 3 1  Control/stop worrying 1 0  Worry too much - different things 1 1  Trouble relaxing 0 0  Restless 1 1  Easily annoyed or irritable 1 0  Afraid - awful might happen 0 0  Total GAD 7 Score 7 3  Anxiety Difficulty Somewhat difficult Not difficult at all   Result reviewed   Fall Risk: Fall Risk  01/01/2020 04/30/2019 03/29/2018 03/29/2018 02/26/2018  Falls in the past year? 0 0 No No No  Number falls in past yr: 0 0 - - -  Injury with Fall? 0 0 - - -  Follow up Falls evaluation completed Falls evaluation completed - - -      Objective:   Vitals:   01/01/20 1329  BP: 118/76  Pulse: 76  Resp: 16  Temp: (!) 97.1 F (36.2 C)  TempSrc: Temporal  SpO2: 96%  Weight: 207 lb 4.8 oz (94 kg)  Height: 5\' 5"  (1.651 m)    Body mass index is 34.5 kg/m.  Physical Exam   NAD, masked, pleasant HEENT - [place]/AT, sclera anicteric, PERRL, EOMI, conj - non-inj'ed, pharynx clear Neck - supple, no adenopathy, no TM, carotids 2+ and = without bruits bilat Car - RRR without m/g/r Pulm- RR and effort normal at rest, CTA without wheeze or rales Abd - soft, NT, ND, BS+,  no masses, no hepatosplenomegaly Back - no CVA tenderness Ext - no LE  edema, Neuro/psychiatric - affect was not flat, appropriate with conversation  Alert and oriented  Grossly non-focal - good strength on testing extremities, sensation intact to LT in distal extremities, DTRs 2-3+ and equal in the patella, good balance on 1 foot,  Speech and gait are normal  Results for orders placed or performed during the hospital encounter of 10/23/19  Lipase, blood  Result Value Ref Range   Lipase 24 11 - 51 U/L  Comprehensive metabolic panel  Result Value Ref Range   Sodium 140 135 - 145 mmol/L   Potassium 3.5 3.5 - 5.1 mmol/L   Chloride 109 98 - 111 mmol/L   CO2 14 (L) 22 - 32 mmol/L   Glucose, Bld 150 (H) 70 - 99 mg/dL   BUN 8 6 - 20 mg/dL   Creatinine, Ser 0.63 0.44 - 1.00 mg/dL   Calcium 9.6 8.9 - 10.3 mg/dL   Total Protein 7.3 6.5 - 8.1 g/dL   Albumin 4.3 3.5 - 5.0 g/dL   AST 22 15 - 41 U/L   ALT 22 0 - 44 U/L   Alkaline Phosphatase 52 38 - 126 U/L   Total Bilirubin 0.7 0.3 - 1.2 mg/dL   GFR calc non Af Amer >60 >60 mL/min   GFR calc Af Amer >60 >60 mL/min   Anion gap 17 (H) 5 - 15  CBC  Result Value Ref Range   WBC 12.4 (H) 4.0 - 10.5 K/uL   RBC 4.98 3.87 - 5.11 MIL/uL   Hemoglobin 14.4 12.0 - 15.0 g/dL   HCT 41.6 36.0 - 46.0 %   MCV 83.5 80.0 - 100.0 fL   MCH 28.9 26.0 - 34.0 pg   MCHC 34.6 30.0 - 36.0 g/dL   RDW 12.3 11.5 - 15.5 %   Platelets 397 150 - 400 K/uL   nRBC 0.0 0.0 - 0.2 %  Urinalysis, Routine w reflex microscopic  Result Value Ref Range   Color, Urine YELLOW YELLOW   APPearance TURBID (A) CLEAR   Specific Gravity, Urine 1.028 1.005 - 1.030   pH 5.0 5.0 - 8.0   Glucose, UA NEGATIVE NEGATIVE mg/dL   Hgb urine dipstick NEGATIVE NEGATIVE   Bilirubin Urine NEGATIVE NEGATIVE   Ketones, ur 80 (A) NEGATIVE mg/dL   Protein, ur 30 (A) NEGATIVE mg/dL   Nitrite NEGATIVE NEGATIVE   Leukocytes,Ua NEGATIVE NEGATIVE   RBC / HPF 0-5 0 - 5 RBC/hpf   WBC, UA 6-10 0 - 5 WBC/hpf   Bacteria, UA RARE (A) NONE SEEN   Squamous Epithelial / LPF  0-5 0 - 5   Mucus PRESENT   Basic metabolic panel  Result Value Ref Range   Sodium 139 135 - 145 mmol/L   Potassium 3.4 (L) 3.5 - 5.1 mmol/L   Chloride 109 98 - 111 mmol/L   CO2 19 (L) 22 - 32 mmol/L   Glucose, Bld 128 (H) 70 - 99 mg/dL   BUN 7 6 - 20 mg/dL   Creatinine, Ser 0.64 0.44 - 1.00 mg/dL   Calcium 8.6 (L) 8.9 - 10.3 mg/dL   GFR calc non Af Amer >60 >60 mL/min   GFR calc Af Amer >60 >60 mL/min   Anion gap 11 5 - 15  I-Stat beta hCG blood, ED  Result Value Ref Range   I-stat hCG, quantitative <5.0 <5 mIU/mL   Comment 3           A repeat EKG today was normal, with the QTc not prolonged (402 ms)     Assessment & Plan:   1. Recurrent major depressive disorder, in partial remission (HCC) Remains stable. We will continue the Lexapro, as she notes it has been very helpful. She also will be contacting counseling here shortly to have them involved as I noted  that can only be helpful in addition - escitalopram (LEXAPRO) 20 MG tablet; Take 1 tablet (20 mg total) by mouth daily.  Dispense: 90 tablet; Refill: 1 - hydrOXYzine (ATARAX/VISTARIL) 10 MG tablet; Take 1 tablet (10 mg total) by mouth 3 (three) times daily as needed.  Dispense: 30 tablet; Refill: 2  2. Generalized anxiety disorder with panic attacks As above, remains stable Continue the Lexapro and the hydroxyzine as needed. Counseling soon to be initiated in combination is planned  3. Class 1 obesity due to excess calories without serious comorbidity with body mass index (BMI) of 34.0 to 34.9 in adult Continue with regular exercise and dietary modifications to help with weight management encouraged.  4. H/O prolonged Q-T interval on ECG A repeat EKG today was normal.  No concerns for QTC prolongation. Do feel continuing the Lexapro is appropriate, and it has been helpful for her. - EKG 12-Lead  Reviewed the recent labs from February, 2021 and her kidney function and liver function tests were normal as part of  that. She still takes her vitamin D supplement, and to continue. She did have the Covid vaccination as well. We will follow-up in approximately 6 months time, sooner as needed.   Jamelle Haring, MD 01/01/20 1:35 PM

## 2020-01-01 ENCOUNTER — Other Ambulatory Visit: Payer: Self-pay

## 2020-01-01 ENCOUNTER — Ambulatory Visit (INDEPENDENT_AMBULATORY_CARE_PROVIDER_SITE_OTHER): Payer: Self-pay | Admitting: Internal Medicine

## 2020-01-01 ENCOUNTER — Encounter: Payer: Self-pay | Admitting: Internal Medicine

## 2020-01-01 VITALS — BP 118/76 | HR 76 | Temp 97.1°F | Resp 16 | Ht 65.0 in | Wt 207.3 lb

## 2020-01-01 DIAGNOSIS — F3341 Major depressive disorder, recurrent, in partial remission: Secondary | ICD-10-CM

## 2020-01-01 DIAGNOSIS — Z6834 Body mass index (BMI) 34.0-34.9, adult: Secondary | ICD-10-CM

## 2020-01-01 DIAGNOSIS — Z87898 Personal history of other specified conditions: Secondary | ICD-10-CM

## 2020-01-01 DIAGNOSIS — F411 Generalized anxiety disorder: Secondary | ICD-10-CM

## 2020-01-01 DIAGNOSIS — F410 Panic disorder [episodic paroxysmal anxiety] without agoraphobia: Secondary | ICD-10-CM | POA: Insufficient documentation

## 2020-01-01 DIAGNOSIS — E6609 Other obesity due to excess calories: Secondary | ICD-10-CM

## 2020-01-01 MED ORDER — HYDROXYZINE HCL 10 MG PO TABS: 10.0000 mg | ORAL_TABLET | Freq: Three times a day (TID) | ORAL | 2 refills | Status: DC | PRN

## 2020-01-01 MED ORDER — ESCITALOPRAM OXALATE 20 MG PO TABS: 20.0000 mg | ORAL_TABLET | Freq: Every day | ORAL | 1 refills | Status: DC

## 2020-01-28 ENCOUNTER — Encounter (HOSPITAL_COMMUNITY): Payer: Self-pay

## 2020-01-28 ENCOUNTER — Emergency Department (HOSPITAL_COMMUNITY): Payer: 59

## 2020-01-28 ENCOUNTER — Emergency Department (HOSPITAL_COMMUNITY)
Admission: EM | Admit: 2020-01-28 | Discharge: 2020-01-28 | Disposition: A | Discharge status: Home / Self Care | Payer: 59 | Attending: Emergency Medicine | Admitting: Emergency Medicine

## 2020-01-28 ENCOUNTER — Ambulatory Visit: Payer: Self-pay | Admitting: *Deleted

## 2020-01-28 ENCOUNTER — Other Ambulatory Visit: Payer: Self-pay

## 2020-01-28 DIAGNOSIS — R1011 Right upper quadrant pain: Secondary | ICD-10-CM | POA: Diagnosis not present

## 2020-01-28 DIAGNOSIS — Z79899 Other long term (current) drug therapy: Secondary | ICD-10-CM | POA: Insufficient documentation

## 2020-01-28 DIAGNOSIS — R112 Nausea with vomiting, unspecified: Secondary | ICD-10-CM | POA: Diagnosis not present

## 2020-01-28 DIAGNOSIS — R109 Unspecified abdominal pain: Secondary | ICD-10-CM

## 2020-01-28 LAB — COMPREHENSIVE METABOLIC PANEL
ALT: 26 U/L (ref 0–44)
AST: 23 U/L (ref 15–41)
Albumin: 4.7 g/dL (ref 3.5–5.0)
Alkaline Phosphatase: 57 U/L (ref 38–126)
Anion gap: 13 (ref 5–15)
BUN: 9 mg/dL (ref 6–20)
CO2: 24 mmol/L (ref 22–32)
Calcium: 9.2 mg/dL (ref 8.9–10.3)
Chloride: 101 mmol/L (ref 98–111)
Creatinine, Ser: 0.81 mg/dL (ref 0.44–1.00)
GFR calc Af Amer: >60 mL/min (ref >60)
GFR calc non Af Amer: >60 mL/min (ref >60)
Glucose, Bld: 116 mg/dL — ABNORMAL HIGH (ref 70–99)
Potassium: 3.3 mmol/L — ABNORMAL LOW (ref 3.5–5.1)
Sodium: 138 mmol/L (ref 135–145)
Total Bilirubin: 1.5 mg/dL — ABNORMAL HIGH (ref 0.3–1.2)
Total Protein: 7.8 g/dL (ref 6.5–8.1)

## 2020-01-28 LAB — URINALYSIS, ROUTINE W REFLEX MICROSCOPIC
Bilirubin Urine: NEGATIVE
Glucose, UA: NEGATIVE mg/dL
Ketones, ur: 20 mg/dL — AB
Leukocytes,Ua: NEGATIVE
Nitrite: NEGATIVE
Protein, ur: NEGATIVE mg/dL
Specific Gravity, Urine: 1.005 (ref 1.005–1.030)
pH: 6 (ref 5.0–8.0)

## 2020-01-28 LAB — I-STAT BETA HCG BLOOD, ED (MC, WL, AP ONLY): I-stat hCG, quantitative: <5 m[IU]/mL (ref <5)

## 2020-01-28 LAB — CBC
HCT: 41.9 % (ref 36.0–46.0)
Hemoglobin: 14.6 g/dL (ref 12.0–15.0)
MCH: 29.6 pg (ref 26.0–34.0)
MCHC: 34.8 g/dL (ref 30.0–36.0)
MCV: 84.8 fL (ref 80.0–100.0)
Platelets: 406 10*3/uL — ABNORMAL HIGH (ref 150–400)
RBC: 4.94 MIL/uL (ref 3.87–5.11)
RDW: 12 % (ref 11.5–15.5)
WBC: 11 10*3/uL — ABNORMAL HIGH (ref 4.0–10.5)
nRBC: 0 % (ref 0.0–0.2)

## 2020-01-28 LAB — LIPASE, BLOOD: Lipase: 25 U/L (ref 11–51)

## 2020-01-28 MED ORDER — ONDANSETRON HCL 4 MG PO TABS
4.0000 mg | ORAL_TABLET | Freq: Four times a day (QID) | ORAL | 0 refills | Status: DC
Start: 2020-01-28 — End: 2021-06-03

## 2020-01-28 MED ORDER — SODIUM CHLORIDE 0.9% FLUSH
3.0000 mL | Freq: Once | INTRAVENOUS | Status: AC
  Administered 2020-01-28: 3 mL via INTRAVENOUS

## 2020-01-28 MED ORDER — SODIUM CHLORIDE 0.9 % IV BOLUS
1000.0000 mL | Freq: Once | INTRAVENOUS | Status: AC
  Administered 2020-01-28: 1000 mL via INTRAVENOUS

## 2020-01-28 MED ORDER — FAMOTIDINE IN NACL 20-0.9 MG/50ML-% IV SOLN
20.0000 mg | Freq: Once | INTRAVENOUS | Status: AC
  Administered 2020-01-28: 20 mg via INTRAVENOUS
  Filled 2020-01-28: qty 50

## 2020-01-28 MED ORDER — DICYCLOMINE HCL 10 MG PO CAPS
10.0000 mg | ORAL_CAPSULE | Freq: Once | ORAL | Status: AC
  Administered 2020-01-28: 10 mg via ORAL
  Filled 2020-01-28: qty 1

## 2020-01-28 MED ORDER — ALUM & MAG HYDROXIDE-SIMETH 200-200-20 MG/5ML PO SUSP
30.0000 mL | Freq: Once | ORAL | Status: AC
  Administered 2020-01-28: 30 mL via ORAL
  Filled 2020-01-28: qty 30

## 2020-01-28 MED ORDER — FAMOTIDINE 20 MG PO TABS
20.0000 mg | ORAL_TABLET | Freq: Two times a day (BID) | ORAL | 0 refills | Status: DC
Start: 2020-01-28 — End: 2020-02-19

## 2020-01-28 MED ORDER — SUCRALFATE 1 GM/10ML PO SUSP
1.0000 g | Freq: Three times a day (TID) | ORAL | Status: DC
  Administered 2020-01-28: 1 g via ORAL
  Filled 2020-01-28: qty 10

## 2020-01-28 MED ORDER — ONDANSETRON HCL 4 MG/2ML IJ SOLN
4.0000 mg | Freq: Once | INTRAMUSCULAR | Status: AC
  Administered 2020-01-28: 4 mg via INTRAVENOUS
  Filled 2020-01-28: qty 2

## 2020-01-28 NOTE — Discharge Instructions (Signed)
Please follow up with your primary doctor or with the gastroenterologist within the next 5-7 days.  If you do not have a primary care provider, information for a healthcare clinic has been provided for you to make arrangements for follow up care. Please return to the ER sooner if you have any new or worsening symptoms, or if you have any of the following symptoms:  Abdominal pain that does not go away.  You have a fever.  You keep throwing up (vomiting).  The pain is felt only in portions of the abdomen. Pain in the right side could possibly be appendicitis. In an adult, pain in the left lower portion of the abdomen could be colitis or diverticulitis.  You pass bloody or black tarry stools.  There is bright red blood in the stool.  The constipation stays for more than 4 days.  There is belly (abdominal) or rectal pain.  You do not seem to be getting better.  You have any questions or concerns.

## 2020-01-28 NOTE — ED Notes (Signed)
Patient has drank 8oz of water without emesis.

## 2020-01-28 NOTE — ED Provider Notes (Signed)
[facility]-EMERGENCY DEPT Provider Note   CSN: [id number] Arrival date & time: 01/28/20  1003     History Chief Complaint  Patient presents with  . Abdominal Pain  . Emesis    Yolanda Gardner is a 24 y.o. female.  HPI   Pt is a 24 y/o female wit ha h/o anxiety/depression who presents to the ED today for eval of abd pain. States she has had intermittent abd pain for several months but that sxs have been worse for the last 6 days. Pain has been intermittent and is located to the epigastrium. Rates pain 1-2/10. Pain rated 9/10 at its worst. Describes pain as soreness and has a bubbly, stingy feeling.  Anxiety and stress seem to play a role in making the pain worse. Food seems to make the sxs worse.  She also reports associated nausea, vomiting. States she has had alternating diarrhea/constipation, last normal BM was 1 week ago. She did have a smaller BM earlier today. Denies dysuria, hematuria.  Denies ETOH use. Denies heavy NSAID use. Denies tobacco use. Denies drug use.   States she took IB guard.  Past Medical History:  Diagnosis Date  . Anxiety   . Depression   . Sinus infection   . Tendonitis     Patient Active Problem List   Diagnosis Date Noted  . Generalized anxiety disorder with panic attacks 01/01/2020  . Class 1 obesity due to excess calories without serious comorbidity with body mass index (BMI) of 34.0 to 34.9 in adult 03/29/2018  . Vitamin D deficiency 05/25/2015  . Jaw pain 05/25/2015  . Arm pain, right 05/25/2015  . Depression, major, recurrent (HCC) 04/24/2015    History reviewed. No pertinent surgical history.   OB History   No obstetric history on file.     Family History  Problem Relation Age of Onset  . Depression Mother   . Hypertension Maternal Grandmother   . Diabetes Maternal Aunt     Social History   Tobacco Use  . Smoking status: Never Smoker  . Smokeless tobacco: Never Used  Substance Use Topics  . Alcohol  use: Yes    Comment: Social  . Drug use: Yes    Types: Marijuana    Home Medications Prior to Admission medications   Medication Sig Start Date End Date Taking? Authorizing Provider  Cholecalciferol (VITAMIN D PO) Take by mouth.    [provider]  escitalopram (LEXAPRO) 20 MG tablet Take 1 tablet (20 mg total) by mouth daily. 01/01/20   Jamelle Haring, MD  famotidine (PEPCID) 20 MG tablet Take 1 tablet (20 mg total) by mouth 2 (two) times daily. 01/28/20   Ellwyn Ergle S, PA-C  hydrOXYzine (ATARAX/VISTARIL) 10 MG tablet Take 1 tablet (10 mg total) by mouth 3 (three) times daily as needed. 01/01/20   Jamelle Haring, MD  Multiple Vitamins-Minerals (WOMENS MULTIVITAMIN PO) Take by mouth.    [provider]  ondansetron (ZOFRAN) 4 MG tablet Take 1 tablet (4 mg total) by mouth every 6 (six) hours. 01/28/20   Tykee Heideman S, PA-C    Allergies    Patient has no known allergies.  Review of Systems   Review of Systems  Constitutional: Negative for chills and fever.  HENT: Negative for ear pain and sore throat.   Eyes: Negative for visual disturbance.  Respiratory: Negative for cough and shortness of breath.   Cardiovascular: Negative for chest pain.  Gastrointestinal: Positive for abdominal pain, constipation, diarrhea, nausea  and vomiting.  Genitourinary: Negative for dysuria and hematuria.  Musculoskeletal: Negative for back pain.  Skin: Negative for rash.  Neurological: Negative for headaches.  All other systems reviewed and are negative.   Physical Exam Updated Vital Signs BP 127/90   Pulse 98   Temp 98.9 F (37.2 C) (Oral)   Resp 16   Ht 5\' 5"  (1.651 m)   Wt 90.7 kg   LMP 01/28/2020   SpO2 95%   BMI 33.28 kg/m   Physical Exam Vitals and nursing note reviewed.  Constitutional:      General: She is not in acute distress.    Appearance: She is well-developed.  HENT:     Head: Normocephalic and atraumatic.  Eyes:      Conjunctiva/sclera: Conjunctivae normal.  Cardiovascular:     Rate and Rhythm: Normal rate and regular rhythm.     Heart sounds: Normal heart sounds. No murmur.  Pulmonary:     Effort: Pulmonary effort is normal. No respiratory distress.     Breath sounds: Normal breath sounds. No wheezing, rhonchi or rales.  Abdominal:     General: Bowel sounds are normal.     Palpations: Abdomen is soft.     Tenderness: There is abdominal tenderness (worse to epigastrium) in the right upper quadrant, epigastric area and left upper quadrant. There is no right CVA tenderness, left CVA tenderness, guarding or rebound.  Musculoskeletal:     Cervical back: Neck supple.  Skin:    General: Skin is warm and dry.  Neurological:     Mental Status: She is alert.     ED Results / Procedures / Treatments   Labs (all labs ordered are listed, but only abnormal results are displayed) Labs Reviewed  COMPREHENSIVE METABOLIC PANEL - Abnormal; Notable for the following components:      Result Value   Potassium 3.3 (*)    Glucose, Bld 116 (*)    Total Bilirubin 1.5 (*)    All other components within normal limits  CBC - Abnormal; Notable for the following components:   WBC 11.0 (*)    Platelets 406 (*)    All other components within normal limits  URINALYSIS, ROUTINE W REFLEX MICROSCOPIC - Abnormal; Notable for the following components:   Hgb urine dipstick LARGE (*)    Ketones, ur 20 (*)    Bacteria, UA RARE (*)    All other components within normal limits  LIPASE, BLOOD  I-STAT BETA HCG BLOOD, ED ([facility], [facility], [facility] ONLY)    EKG None  Radiology 03/29/2020 Abdomen Limited RUQ  Result Date: 01/28/2020 CLINICAL DATA:  Right upper quadrant pain EXAM: ULTRASOUND ABDOMEN LIMITED RIGHT UPPER QUADRANT COMPARISON:  10/23/2019 CT abdomen pelvis. FINDINGS: Gallbladder: No gallstones or wall thickening visualized. No sonographic Murphy sign noted by sonographer. Common bile duct: Diameter: 3 mm Liver: No focal lesion identified.  Within normal limits in parenchymal echogenicity. Portal vein is patent on color Doppler imaging with normal direction of blood flow towards the liver. Other: None. IMPRESSION: Normal right upper quadrant ultrasound. Electronically Signed   By: 10/25/2019 M.D.   On: 01/28/2020 12:54    Procedures Procedures (including critical care time)  Medications Ordered in ED Medications  sucralfate (CARAFATE) 1 GM/10ML suspension 1 g (1 g Oral Given 01/28/20 1158)  sodium chloride flush (NS) 0.9 % injection 3 mL (3 mLs Intravenous Given 01/28/20 1112)  famotidine (PEPCID) IVPB 20 mg premix (20 mg Intravenous New Bag/Given 01/28/20 1202)  alum & mag hydroxide-simeth (  MAALOX/MYLANTA) 200-200-20 MG/5ML suspension 30 mL (30 mLs Oral Given 01/28/20 1158)  dicyclomine (BENTYL) capsule 10 mg (10 mg Oral Given 01/28/20 1159)  ondansetron (ZOFRAN) injection 4 mg (4 mg Intravenous Given 01/28/20 1200)  sodium chloride 0.9 % bolus 1,000 mL (1,000 mLs Intravenous New Bag/Given 01/28/20 1202)    ED Course  I have reviewed the triage vital signs and the nursing notes.  Pertinent labs & imaging results that were available during my care of the patient were reviewed by me and considered in my medical decision making (see chart for details).    MDM Rules/Calculators/A&P                      24 year old female presenting for evaluation of epigastric abdominal pain ongoing intermittently for several months but worse over the last week.  Associate with some nausea vomiting.  Is worse with certain foods.  Reviewed/interpreted CBC is without leukocytosis or anemia CMP with mild hypokalemia, normal kidney function.  Liver enzymes are normal.  Bilirubin is slightly elevated. Lipase is negative Beta-hCG negative UA is without evidence of UTI, but does show some ketonuria consistent with decreased p.o. intake and likely mild dehydration.  Given patient's bilirubin is slightly elevated, has epigastric pain and seems to have  worse pain with eating, will obtain right upper [place] ultrasound  Right upper quadrant ultrasound showed normal gallbladder without any evidence of cholecystitis.  Patient given IV fluids, Pepcid, Zofran, Bentyl, Carafate and GI cocktail.  On reassessment she states she feels much improved.  She has been able to tolerate p.o.  Suspect symptoms may be related to acid reflux versus gastritis.  Will treat with Pepcid.  We will also give Rx for Zofran for home.  Given prolonged symptoms, will have her follow-up with GI.  Advised her on some she voiced understanding plan reasons to return but all questions answered.  Patient stable for discharge.  Final Clinical Impression(s) / ED Diagnoses Final diagnoses:  RUQ pain  Abdominal pain, unspecified abdominal location  Non-intractable vomiting with nausea, unspecified vomiting type    Rx / DC Orders ED Discharge Orders         Ordered    famotidine (PEPCID) 20 MG tablet  2 times daily     01/28/20 1343    ondansetron (ZOFRAN) 4 MG tablet  Every 6 hours     01/28/20 1343           Aftyn Nott S, PA-C 01/28/20 1344    Tegeler, Gwenyth Allegra, MD 01/28/20 1709

## 2020-01-28 NOTE — ED Triage Notes (Signed)
Patient c/o mid upper abdominal pain and emesis x 6 days.

## 2020-01-28 NOTE — Telephone Encounter (Signed)
Patient went to the emergency department to be seen. [facility]

## 2020-01-28 NOTE — Telephone Encounter (Signed)
Would you like patient to come in for an office visit if there is any availability? Or prefer virtual?

## 2020-01-28 NOTE — Telephone Encounter (Signed)
Pt called with complaints of burning throuoghout her stomach, intermittent vomiting/nausea since 01/24/20; scant stool, stomach cramping, chest burning; she feels dehydrated; her last BM was 01/23/20; she has 4-6 episodes of scant diarrhea; her pain is rated 8 out of 10 and is "almost constant"; the pt says she feels dehydrated because of mouth is dry;she is also having weakness; recommendations made per nurse triage protocol; she verbalized understanding; the pt sees Dr Dorris Fetch, Evalee Jefferson; will route to office for notification.    Reason for Disposition . [1] SEVERE pain (e.g., excruciating) AND [2] present > 1 hour  Answer Assessment - Initial Assessment Questions 1. LOCATION: "Where does it hurt?"     "all over" upper > lower 2. RADIATION: "Does the pain shoot anywhere else?" (e.g., chest, back)     Upper back, chest 3. ONSET: "When did the pain begin?" (e.g., minutes, hours or days ago)      01/24/20 4. SUDDEN: "Gradual or sudden onset?"    gradually 5. PATTERN "Does the pain come and go, or is it constant?"    - If constant: "Is it getting better, staying the same, or worsening?"      (Note: Constant means the pain never goes away completely; most serious pain is constant and it progresses)     - If intermittent: "How long does it last?" "Do you have pain now?"     (Note: Intermittent means the pain goes away completely between bouts)     constantly 6. SEVERITY: "How bad is the pain?"  (e.g., Scale 1-10; mild, moderate, or severe)   - MILD (1-3): doesn't interfere with normal activities, abdomen soft and not tender to touch    - MODERATE (4-7): interferes with normal activities or awakens from sleep, tender to touch    - SEVERE (8-10): excruciating pain, doubled over, unable to do any normal activities    Rated 8 out of 10 7. RECURRENT SYMPTOM: "Have you ever had this type of abdominal pain before?" If so, ask: "When was the last time?" and "What happened that time?"      Yes, went  to ED about a month ago 8. CAUSE: "What do you think is causing the abdominal pain?"     Not sure 9. RELIEVING/AGGRAVATING FACTORS: "What makes it better or worse?" (e.g., movement, antacids, bowel movement)    Hot shower helps 10. OTHER SYMPTOMS: "Has there been any vomiting, diarrhea, constipation, or urine problems?"       Vomiting, diarrhea; feels dehydrated (mouth dry); burning throughout stomach 11. PREGNANCY: "Is there any chance you are pregnant?" "When was your last menstrual period?"      LBM 01/16/20  Protocols used: ABDOMINAL PAIN - [facility]

## 2020-01-29 ENCOUNTER — Encounter: Payer: Self-pay | Admitting: Nurse Practitioner

## 2020-02-07 ENCOUNTER — Encounter: Payer: Self-pay | Admitting: Gastroenterology

## 2020-02-07 ENCOUNTER — Ambulatory Visit (INDEPENDENT_AMBULATORY_CARE_PROVIDER_SITE_OTHER): Payer: 59 | Admitting: Nurse Practitioner

## 2020-02-07 ENCOUNTER — Other Ambulatory Visit (INDEPENDENT_AMBULATORY_CARE_PROVIDER_SITE_OTHER): Payer: 59

## 2020-02-07 ENCOUNTER — Encounter: Payer: Self-pay | Admitting: Nurse Practitioner

## 2020-02-07 VITALS — BP 104/76 | HR 81 | Ht 65.0 in | Wt 198.0 lb

## 2020-02-07 DIAGNOSIS — R112 Nausea with vomiting, unspecified: Secondary | ICD-10-CM | POA: Diagnosis not present

## 2020-02-07 DIAGNOSIS — R1013 Epigastric pain: Secondary | ICD-10-CM

## 2020-02-07 LAB — BASIC METABOLIC PANEL
BUN: 11 mg/dL (ref 6–23)
CO2: 27 mEq/L (ref 19–32)
Calcium: 9.1 mg/dL (ref 8.4–10.5)
Chloride: 105 mEq/L (ref 96–112)
Creatinine, Ser: 0.76 mg/dL (ref 0.40–1.20)
GFR: 93.69 mL/min (ref >60.00)
Glucose, Bld: 95 mg/dL (ref 70–99)
Potassium: 4.1 mEq/L (ref 3.5–5.1)
Sodium: 137 mEq/L (ref 135–145)

## 2020-02-07 LAB — HEPATIC FUNCTION PANEL
ALT: 15 U/L (ref 0–35)
AST: 15 U/L (ref 0–37)
Albumin: 4.3 g/dL (ref 3.5–5.2)
Alkaline Phosphatase: 52 U/L (ref 39–117)
Bilirubin, Direct: 0.1 mg/dL (ref 0.0–0.3)
Total Bilirubin: 0.3 mg/dL (ref 0.2–1.2)
Total Protein: 7 g/dL (ref 6.0–8.3)

## 2020-02-07 NOTE — Progress Notes (Signed)
02/07/2020 Yolanda Gardner [id number] 07/11/1996   CHIEF COMPLAINT: Epigastric pain  HISTORY OF PRESENT ILLNESS:  Yolanda Gardner is a 24 year old female with a past medical history of prolonged QT interval, anxiety, depression and GERD.  No past surgical history. She presents today as referred by her primary care physician Dr. Lebron Conners for further evaluation for epigastric pain.  Wed 01/22/2020 she awakened in the morning with nausea and vomiting. She felt anxious at that time. She vomited clear acid secretions then bilious type emesis. She tried to drink water but she vomited up the water. She continued to have nausea with stomach burning pain with a stomach tightness sensation. She presented to [facility] emergency room on 01/28/2020 with epigastric pain.  Laboratory studies in the ED showed a potassium level of 3.3. Total bili 1.5.  AST/ALT and Lipase levels were normal.  A right upper quadrant abdominal sonogram identified a normal liver, gallbladder and common bile duct. She complains of having stomach pain and bloat after she eats most foods. No specific food triggers. She has occasional heartburn for which she takes Prilosec for 1 week at a time. She has small constipated bowel movements at times and some days she has a little diarrhea. No rectal bleeding or black stools. No NSAID use. She last took an antibiotics for a dog bite to her left hand  09/2019.  She has anxiety and she acknowledges her GI symptoms are worse when she is anxious.   In review of he Epic records, she was in the ER 10/20/2019 due to having a dog bite to her left hand and fingers for which she was prescribed Augmentin and she received a series of rabies vaccinations.  She developed N/V and epigastric pain and she presented to [facility] urgent care then [facility] ED on 10/23/2019. She was dehydrated and acidotic. She received IV fluids, Pepcid and Compazine.  An abd/pelvic CT done at that time showed possible mild  colitis. She reported having some diarrhea at that time. Her symptoms were most likely due to taking Augmentin on an empty stomach. No further GI evaluation was required at that time as her symptoms resolved.     CBC Latest Ref Rng & Units 01/28/2020 10/23/2019 03/29/2018  WBC 4.0 - 10.5 K/uL 11.0(H) 12.4(H) 7.9  Hemoglobin 12.0 - 15.0 g/dL 14.6 14.4 14.6  Hematocrit 36 - 46 % 41.9 41.6 42.7  Platelets 150 - 400 K/uL 406(H) 397 390   CMP Latest Ref Rng & Units 01/28/2020 10/23/2019 10/23/2019  Glucose 70 - 99 mg/dL 116(H) 128(H) 150(H)  BUN 6 - 20 mg/dL 9 7 8   Creatinine 0.44 - 1.00 mg/dL 0.81 0.64 0.63  Sodium 135 - 145 mmol/L 138 139 140  Potassium 3.5 - 5.1 mmol/L 3.3(L) 3.4(L) 3.5  Chloride 98 - 111 mmol/L 101 109 109  CO2 22 - 32 mmol/L 24 19(L) 14(L)  Calcium 8.9 - 10.3 mg/dL 9.2 8.6(L) 9.6  Total Protein 6.5 - 8.1 g/dL 7.8 - 7.3  Total Bilirubin 0.3 - 1.2 mg/dL 1.5(H) - 0.7  Alkaline Phos 38 - 126 U/L 57 - 52  AST 15 - 41 U/L 23 - 22  ALT 0 - 44 U/L 26 - 22      Component Value Date/Time   LIPASE 25 01/28/2020 1113   Weight Readings: Today weight 198lbs.  01/01/20 207 lb 4.8 oz (94 kg)  10/20/19 198 lb (89.8 kg)  03/29/18 241 lb 1.6 oz (109.4 kg)  Abdominal/pelvic CT 10/23/2019: 1. No CT evidence of bowel obstruction. 2. Question some minimal edematous changes of the colonic wall with associated mesenteric comb sign which could reflect a mild colitis, either infectious or inflammatory. 3. Numerous though nonenlarged mesenteric nodes are nonspecific, possibly reactive. 4. Aortic Atherosclerosis   Past Medical History:  Diagnosis Date  . Anxiety   . Depression   . Sinus infection   . Tendonitis     Social History:  She is single. Forensic psychologist. Nonsmoker. No alcohol use. She denies drug use.   Family History: Mother with depression. Maternal grandfather with HTN.  Paternal grandmother had gallbladder cancer. Father's history unknown.   No Known  Allergies    Outpatient Encounter Medications as of 02/07/2020  Medication Sig  . Cholecalciferol (VITAMIN D PO) Take by mouth.  . escitalopram (LEXAPRO) 20 MG tablet Take 1 tablet (20 mg total) by mouth daily.  . famotidine (PEPCID) 20 MG tablet Take 1 tablet (20 mg total) by mouth 2 (two) times daily.  . hydrOXYzine (ATARAX/VISTARIL) 10 MG tablet Take 1 tablet (10 mg total) by mouth 3 (three) times daily as needed.  . Multiple Vitamins-Minerals (WOMENS MULTIVITAMIN PO) Take by mouth.  . ondansetron (ZOFRAN) 4 MG tablet Take 1 tablet (4 mg total) by mouth every 6 (six) hours.   No facility-administered encounter medications on file as of 02/07/2020.     REVIEW OF SYSTEMS: All other systems reviewed and negative except where noted in the History of Present Illness.  PHYSICAL EXAM: LMP 01/28/2020   BP 104/76   Pulse 81   Ht 5\' 5"  (1.651 m)   Wt 198 lb (89.8 kg)   LMP 01/28/2020   BMI 32.95 kg/m   General: Well developed  24 year old female in no acute distress. Head: Normocephalic and atraumatic. Eyes:  Sclerae non-icteric, conjunctive pink. Ears: Normal auditory acuity. Mouth: Dentition intact. No ulcers or lesions.  Neck: Supple, no lymphadenopathy or thyromegaly.  Lungs: Clear bilaterally to auscultation without wheezes, crackles or rhonchi. Heart: Regular rate and rhythm. No murmur, rub or gallop appreciated.  Abdomen: Soft, nontender, non distended. No masses. No hepatosplenomegaly. Normoactive bowel sounds x 4 quadrants.  Rectal: Deferred.  Musculoskeletal: Symmetrical with no gross deformities. Skin: Warm and dry. No rash or lesions on visible extremities. Extremities: No edema. Neurological: Alert oriented x 4, no focal deficits.  Psychological:  Alert and cooperative. Normal mood and affect.  ASSESSMENT AND PLAN:  14. 24 year old female with N/V an epigastric pain. Normal abdominal sonogram. K+ 3.3. T. Bili 1.5. with normal LFTs.  -EGD benefits and risks discussed  including risk with sedation, risk of bleeding, perforation and infection  -BMP, hepatic panel  -IF EGD negative, I will order a CCK HIDA -Patient to call our office if he symptoms worsen   2. GERD -Pepcid 20mg  daily  -GERD handout -See plan in # 1   3. Anxiety and depression on Lexapro as followed by Dr. 30    CC:  D*

## 2020-02-07 NOTE — Patient Instructions (Addendum)
If you are age 24 or older, your body mass index should be between 23-30. Your Body mass index is 32.95 kg/m. If this is out of the aforementioned range listed, please consider follow up with your Primary Care Provider.  If you are age 45 or younger, your body mass index should be between 19-25. Your Body mass index is 32.95 kg/m. If this is out of the aformentioned range listed, please consider follow up with your Primary Care Provider.   Continue taking your Famotidine  You have been scheduled for an endoscopy. Please follow written instructions given to you at your visit today. If you use inhalers (even only as needed), please bring them with you on the day of your procedure. Gastroesophageal Reflux Disease, Adult Gastroesophageal reflux (GER) happens when acid from the stomach flows up into the tube that connects the mouth and the stomach (esophagus). Normally, food travels down the esophagus and stays in the stomach to be digested. With GER, food and stomach acid sometimes move back up into the esophagus. You may have a disease called gastroesophageal reflux disease (GERD) if the reflux:  Happens often.  Causes frequent or very bad symptoms.  Causes problems such as damage to the esophagus. When this happens, the esophagus becomes sore and swollen (inflamed). Over time, GERD can make small holes (ulcers) in the lining of the esophagus. What are the causes? This condition is caused by a problem with the muscle between the esophagus and the stomach. When this muscle is weak or not normal, it does not close properly to keep food and acid from coming back up from the stomach. The muscle can be weak because of:  Tobacco use.  Pregnancy.  Having a certain type of hernia (hiatal hernia).  Alcohol use.  Certain foods and drinks, such as coffee, chocolate, onions, and peppermint. What increases the risk? You are more likely to develop this condition if you:  Are overweight.  Have a  disease that affects your connective tissue.  Use NSAID medicines. What are the signs or symptoms? Symptoms of this condition include:  Heartburn.  Difficult or painful swallowing.  The feeling of having a lump in the throat.  A bitter taste in the mouth.  Bad breath.  Having a lot of saliva.  Having an upset or bloated stomach.  Belching.  Chest pain. Different conditions can cause chest pain. Make sure you see your doctor if you have chest pain.  Shortness of breath or noisy breathing (wheezing).  Ongoing (chronic) cough or a cough at night.  Wearing away of the surface of teeth (tooth enamel).  Weight loss. How is this treated? Treatment will depend on how bad your symptoms are. Your doctor may suggest:  Changes to your diet.  Medicine.  Surgery. Follow these instructions at home: Eating and drinking   Follow a diet as told by your doctor. You may need to avoid foods and drinks such as: ? Coffee and tea (with or without caffeine). ? Drinks that contain alcohol. ? Energy drinks and sports drinks. ? Bubbly (carbonated) drinks or sodas. ? Chocolate and cocoa. ? Peppermint and mint flavorings. ? Garlic and onions. ? Horseradish. ? Spicy and acidic foods. These include peppers, chili powder, curry powder, vinegar, hot sauces, and BBQ sauce. ? Citrus fruit juices and citrus fruits, such as oranges, lemons, and limes. ? Tomato-based foods. These include red sauce, chili, salsa, and pizza with red sauce. ? Fried and fatty foods. These include donuts, french fries, potato chips,  and high-fat dressings. ? High-fat meats. These include hot dogs, rib eye steak, sausage, ham, and bacon. ? High-fat dairy items, such as whole milk, butter, and cream cheese.  Eat small meals often. Avoid eating large meals.  Avoid drinking large amounts of liquid with your meals.  Avoid eating meals during the 2-3 hours before bedtime.  Avoid lying down right after you eat.  Do  not exercise right after you eat. Lifestyle   Do not use any products that contain nicotine or tobacco. These include cigarettes, e-cigarettes, and chewing tobacco. If you need help quitting, ask your doctor.  Try to lower your stress. If you need help doing this, ask your doctor.  If you are overweight, lose an amount of weight that is healthy for you. Ask your doctor about a safe weight loss goal. General instructions  Pay attention to any changes in your symptoms.  Take over-the-counter and prescription medicines only as told by your doctor. Do not take aspirin, ibuprofen, or other NSAIDs unless your doctor says it is okay.  Wear loose clothes. Do not wear anything tight around your waist.  Raise (elevate) the head of your bed about 6 inches (15 cm).  Avoid bending over if this makes your symptoms worse.  Keep all follow-up visits as told by your doctor. This is important. Contact a doctor if:  You have new symptoms.  You lose weight and you do not know why.  You have trouble swallowing or it hurts to swallow.  You have wheezing or a cough that keeps happening.  Your symptoms do not get better with treatment.  You have a hoarse voice. Get help right away if:  You have pain in your arms, neck, jaw, teeth, or back.  You feel sweaty, dizzy, or light-headed.  You have chest pain or shortness of breath.  You throw up (vomit) and your throw-up looks like blood or coffee grounds.  You pass out (faint).  Your poop (stool) is bloody or black.  You cannot swallow, drink, or eat. Summary  If a person has gastroesophageal reflux disease (GERD), food and stomach acid move back up into the esophagus and cause symptoms or problems such as damage to the esophagus.  Treatment will depend on how bad your symptoms are.  Follow a diet as told by your doctor.  Take all medicines only as told by your doctor. This information is not intended to replace advice given to you by  your health care provider. Make sure you discuss any questions you have with your health care provider. Document Revised: 02/21/2018 Document Reviewed: 02/21/2018 Elsevier Patient Education  2020 [employer].  Food Choices for Gastroesophageal Reflux Disease, Adult When you have gastroesophageal reflux disease (GERD), the foods you eat and your eating habits are very important. Choosing the right foods can help ease your discomfort. Think about working with a nutrition specialist (dietitian) to help you make good choices. What are tips for following this plan?  Meals  Choose healthy foods that are low in fat, such as fruits, vegetables, whole grains, low-fat dairy products, and lean meat, fish, and poultry.  Eat small meals often instead of 3 large meals a day. Eat your meals slowly, and in a place where you are relaxed. Avoid bending over or lying down until 2-3 hours after eating.  Avoid eating meals 2-3 hours before bed.  Avoid drinking a lot of liquid with meals.  Cook foods using methods other than frying. Bake, grill, or broil food instead.  Avoid or limit: ? Chocolate. ? Peppermint or spearmint. ? Alcohol. ? Pepper. ? Black and decaffeinated coffee. ? Black and decaffeinated tea. ? Bubbly (carbonated) soft drinks. ? Caffeinated energy drinks and soft drinks.  Limit high-fat foods such as: ? Fatty meat or fried foods. ? Whole milk, cream, butter, or ice cream. ? Nuts and nut butters. ? Pastries, donuts, and sweets made with butter or shortening.  Avoid foods that cause symptoms. These foods may be different for everyone. Common foods that cause symptoms include: ? Tomatoes. ? Oranges, lemons, and limes. ? Peppers. ? Spicy food. ? Onions and garlic. ? Vinegar. Lifestyle  Maintain a healthy weight. Ask your doctor what weight is healthy for you. If you need to lose weight, work with your doctor to do so safely.  Exercise for at least 30 minutes for 5 or more days  each week, or as told by your doctor.  Wear loose-fitting clothes.  Do not smoke. If you need help quitting, ask your doctor.  Sleep with the head of your bed higher than your feet. Use a wedge under the mattress or blocks under the bed frame to raise the head of the bed. Summary  When you have gastroesophageal reflux disease (GERD), food and lifestyle choices are very important in easing your symptoms.  Eat small meals often instead of 3 large meals a day. Eat your meals slowly, and in a place where you are relaxed.  Limit high-fat foods such as fatty meat or fried foods.  Avoid bending over or lying down until 2-3 hours after eating.  Avoid peppermint and spearmint, caffeine, alcohol, and chocolate. This information is not intended to replace advice given to you by your health care provider. Make sure you discuss any questions you have with your health care provider. Document Revised: 12/06/2018 Document Reviewed: 09/20/2016 Elsevier Patient Education  [employer].    Due to recent changes in healthcare laws, you may see the results of your imaging and laboratory studies on MyChart before your provider has had a chance to review them.  We understand that in some cases there may be results that are confusing or concerning to you. Not all laboratory results come back in the same time frame and the provider may be waiting for multiple results in order to interpret others.  Please give [place] 48 hours in order for your provider to thoroughly review all the results before contacting the office for clarification of your results.   Your provider has requested that you go to the basement level for lab work before leaving today. Press "B" on the elevator. The lab is located at the first door on the left as you exit the elevator.

## 2020-02-11 ENCOUNTER — Encounter: Payer: Self-pay | Admitting: Gastroenterology

## 2020-02-11 ENCOUNTER — Other Ambulatory Visit: Payer: Self-pay

## 2020-02-11 ENCOUNTER — Ambulatory Visit (AMBULATORY_SURGERY_CENTER): Payer: 59 | Admitting: Gastroenterology

## 2020-02-11 VITALS — BP 113/67 | HR 56 | Temp 97.8°F | Resp 56 | Ht 65.0 in | Wt 198.0 lb

## 2020-02-11 DIAGNOSIS — K3189 Other diseases of stomach and duodenum: Secondary | ICD-10-CM | POA: Diagnosis not present

## 2020-02-11 DIAGNOSIS — K2970 Gastritis, unspecified, without bleeding: Secondary | ICD-10-CM | POA: Diagnosis not present

## 2020-02-11 DIAGNOSIS — R112 Nausea with vomiting, unspecified: Secondary | ICD-10-CM

## 2020-02-11 DIAGNOSIS — R1013 Epigastric pain: Secondary | ICD-10-CM

## 2020-02-11 DIAGNOSIS — K2950 Unspecified chronic gastritis without bleeding: Secondary | ICD-10-CM

## 2020-02-11 MED ORDER — SODIUM CHLORIDE 0.9 % IV SOLN: 500.0000 mL | Freq: Once | INTRAVENOUS | Status: DC

## 2020-02-11 NOTE — Progress Notes (Signed)
Report to PACU, RN, vss, BBS= Clear.  

## 2020-02-11 NOTE — Progress Notes (Signed)
Reviewed and agree with management plans. ? ?Rose Hippler L. Santhiago Collingsworth, MD, MPH  ?

## 2020-02-11 NOTE — Patient Instructions (Signed)
Please read handouts provided. Continue present medications. Await pathology results.    YOU HAD AN ENDOSCOPIC PROCEDURE TODAY AT THE [facility]:   Refer to the procedure report that was given to you for any specific questions about what was found during the examination.  If the procedure report does not answer your questions, please call your gastroenterologist to clarify.  If you requested that your care partner not be given the details of your procedure findings, then the procedure report has been included in a sealed envelope for you to review at your convenience later.  YOU SHOULD EXPECT: Some feelings of bloating in the abdomen. Passage of more gas than usual.  Walking can help get rid of the air that was put into your GI tract during the procedure and reduce the bloating. If you had a lower endoscopy (such as a colonoscopy or flexible sigmoidoscopy) you may notice spotting of blood in your stool or on the toilet paper. If you underwent a bowel prep for your procedure, you may not have a normal bowel movement for a few days.  Please Note:  You might notice some irritation and congestion in your nose or some drainage.  This is from the oxygen used during your procedure.  There is no need for concern and it should clear up in a day or so.  SYMPTOMS TO REPORT IMMEDIATELY:    Following upper endoscopy (EGD)  Vomiting of blood or coffee ground material  New chest pain or pain under the shoulder blades  Painful or persistently difficult swallowing  New shortness of breath  Fever of 100F or higher  Black, tarry-looking stools  For urgent or emergent issues, a gastroenterologist can be reached at any hour by calling [phone]. Do not use MyChart messaging for urgent concerns.    DIET:  We do recommend a small meal at first, but then you may proceed to your regular diet.  Drink plenty of fluids but you should avoid alcoholic beverages for 24 hours.  ACTIVITY:  You  should plan to take it easy for the rest of today and you should NOT DRIVE or use heavy machinery until tomorrow (because of the sedation medicines used during the test).    FOLLOW UP: Our staff will call the number listed on your records 48-72 hours following your procedure to check on you and address any questions or concerns that you may have regarding the information given to you following your procedure. If we do not reach you, we will leave a message.  We will attempt to reach you two times.  During this call, we will ask if you have developed any symptoms of COVID 19. If you develop any symptoms (ie: fever, flu-like symptoms, shortness of breath, cough etc.) before then, please call [phone].  If you test positive for Covid 19 in the 2 weeks post procedure, please call and report this information to us.    If any biopsies were taken you will be contacted by phone or by letter within the next 1-3 weeks.  Please call us at [phone] if you have not heard about the biopsies in 3 weeks.    SIGNATURES/CONFIDENTIALITY: You and/or your care partner have signed paperwork which will be entered into your electronic medical record.  These signatures attest to the fact that that the information above on your After Visit Summary has been reviewed and is understood.  Full responsibility of the confidentiality of this discharge information lies with you and/or your care-partner. 

## 2020-02-11 NOTE — Op Note (Signed)
[facility] Patient Name: Yolanda Gardner Procedure Date: 02/11/2020 7:56 AM MRN: [id number] Endoscopist: Thornton Park MD, MD Age: 24 Referring MD:  Date of Birth: 1995/12/04 Gender: Female Account #: [id number] Procedure:                Upper GI endoscopy Indications:              Epigastric abdominal pain, Nausea with vomiting Medicines:                Monitored Anesthesia Care Procedure:                Pre-Anesthesia Assessment:                           - Prior to the procedure, a History and Physical                            was performed, and patient medications and                            allergies were reviewed. The patient's tolerance of                            previous anesthesia was also reviewed. The risks                            and benefits of the procedure and the sedation                            options and risks were discussed with the patient.                            All questions were answered, and informed consent                            was obtained. Prior Anticoagulants: The patient has                            taken no previous anticoagulant or antiplatelet                            agents. ASA Grade Assessment: II - A patient with                            mild systemic disease. After reviewing the risks                            and benefits, the patient was deemed in                            satisfactory condition to undergo the procedure.                           After obtaining informed consent, the endoscope was  passed under direct vision. Throughout the                            procedure, the patient's blood pressure, pulse, and                            oxygen saturations were monitored continuously. The                            Endoscope was introduced through the mouth, and                            advanced to the third part of duodenum. The upper                            GI  endoscopy was accomplished without difficulty.                            The patient tolerated the procedure well. Scope In: Scope Out: Findings:                 The examined esophagus was normal. Biopsies were                            taken from the mid/proximal and distal esophagus                            with a cold forceps for histology. Estimated blood                            loss was minimal.                           The entire examined stomach was normal. Biopsies                            were taken from the antrum, body, and fundus with a                            cold forceps for histology. Estimated blood loss                            was minimal.                           A medium-sized hiatal hernia was present.                           The examined duodenum was normal. Biopsies were                            taken with a cold forceps for histology. Estimated                            blood loss was  minimal.                           The cardia and gastric fundus were normal on                            retroflexion.                           The exam was otherwise without abnormality. Complications:            No immediate complications. Estimated blood loss:                            Minimal. Estimated Blood Loss:     Estimated blood loss was minimal. Impression:               - Normal esophagus. Biopsied.                           - Normal stomach. Biopsied.                           - Medium-sized hiatal hernia.                           - Normal examined duodenum. Biopsied.                           - The examination was otherwise normal. Recommendation:           - Patient has a contact number available for                            emergencies. The signs and symptoms of potential                            delayed complications were discussed with the                            patient. Return to normal activities tomorrow.                             Written discharge instructions were provided to the                            patient.                           - Resume previous diet.                           - Continue present medications.                           - Await pathology results.                           - Proceed with HIDA with [facility] for  further evaluation. Tressia Danas MD, MD 02/11/2020 8:14:16 AM This report has been signed electronically.

## 2020-02-11 NOTE — Progress Notes (Signed)
Called to room to assist during endoscopic procedure.  Patient ID and intended procedure confirmed with present staff. Received instructions for my participation in the procedure from the performing physician.  

## 2020-02-11 NOTE — Progress Notes (Signed)
Pt smoked marijuana yesterday. AW iv and CW vitals.

## 2020-02-12 ENCOUNTER — Telehealth: Payer: Self-pay

## 2020-02-12 ENCOUNTER — Other Ambulatory Visit: Payer: Self-pay

## 2020-02-12 DIAGNOSIS — R1013 Epigastric pain: Secondary | ICD-10-CM

## 2020-02-12 DIAGNOSIS — R112 Nausea with vomiting, unspecified: Secondary | ICD-10-CM

## 2020-02-12 NOTE — Telephone Encounter (Signed)
Pt scheduled for HIDA scan at [facility] 02/18/20@7 :30am, pt to arrive there at 7:15am. Pt to be NPO after midnight. Pt aware.

## 2020-02-12 NOTE — Telephone Encounter (Signed)
-----   Message from Tressia Danas, MD sent at 02/11/2020 10:23 AM EDT ----- Please schedule HIDA with [facility] for nausea, vomiting, abdominal pain.  Thank you!  KLB

## 2020-02-13 ENCOUNTER — Telehealth: Payer: Self-pay

## 2020-02-13 ENCOUNTER — Telehealth: Payer: Self-pay | Admitting: *Deleted

## 2020-02-13 NOTE — Telephone Encounter (Signed)
  Follow up Call-  Call back number 02/11/2020  Post procedure Call Back phone  # [phone]  Permission to leave phone message Yes  Some recent data might be hidden     Patient questions:  Do you have a fever, pain , or abdominal swelling? No. Pain Score  0 *  Have you tolerated food without any problems? Yes.    Have you been able to return to your normal activities? Yes.    Do you have any questions about your discharge instructions: Diet   No. Medications  No. Follow up visit  No.  Do you have questions or concerns about your Care? No.  Actions: * If pain score is 4 or above: No action needed, pain <4.  1. Have you developed a fever since your procedure? no  2.   Have you had an respiratory symptoms (SOB or cough) since your procedure? no  3.   Have you tested positive for COVID 19 since your procedure no  4.   Have you had any family members/close contacts diagnosed with the COVID 19 since your procedure?  no   If yes to any of these questions please route to Laverna Peace, RN and Charlett Lango, RN

## 2020-02-13 NOTE — Telephone Encounter (Signed)
°  Follow up Call-  Call back number 02/11/2020  Post procedure Call Back phone  # [phone]  Permission to leave phone message Yes  Some recent data might be hidden    No answer at # given.  LM on VM.

## 2020-02-14 ENCOUNTER — Encounter: Payer: Self-pay | Admitting: Gastroenterology

## 2020-02-18 ENCOUNTER — Encounter (HOSPITAL_COMMUNITY)
Admission: RE | Admit: 2020-02-18 | Discharge: 2020-02-18 | Disposition: A | Discharge status: Home / Self Care | Payer: 59 | Source: Ambulatory Visit | Attending: Gastroenterology | Admitting: Gastroenterology

## 2020-02-18 ENCOUNTER — Other Ambulatory Visit: Payer: Self-pay

## 2020-02-18 DIAGNOSIS — R1013 Epigastric pain: Secondary | ICD-10-CM | POA: Diagnosis present

## 2020-02-18 DIAGNOSIS — R112 Nausea with vomiting, unspecified: Secondary | ICD-10-CM | POA: Insufficient documentation

## 2020-02-18 MED ORDER — TECHNETIUM TC 99M MEBROFENIN IV KIT
5.3000 | PACK | Freq: Once | INTRAVENOUS | Status: AC | PRN
  Administered 2020-02-18: 5.3 via INTRAVENOUS

## 2020-02-19 ENCOUNTER — Other Ambulatory Visit: Payer: Self-pay | Admitting: Internal Medicine

## 2020-02-19 NOTE — Telephone Encounter (Signed)
Medication Refill - Medication: famotidine (PEPCID) 20 MG tablet  Pt was prescribed this after the ED, she says this works well for her   Has the patient contacted their pharmacy? Yes.   (Agent: If no, request that the patient contact the pharmacy for the refill.) (Agent: If yes, when and what did the pharmacy advise?)  Preferred Pharmacy (with phone number or street name):  [id number] G[place] Otto, [place] - [address] AT [facility] [address]  [address] [place] [address]  Phone: [phone] Fax: [phone]     Agent: Please be advised that RX refills may take up to 3 business days. We ask that you follow-up with your pharmacy.

## 2020-02-19 NOTE — Telephone Encounter (Signed)
This refill request for Pepcid 20 mg came to the [facility].   However it comes from Dr. Welford Roche under [facility] [employer].  Returning for Dr. Dorris Fetch to review at [facility].  Pt wants it sent to [facility] pharmacy [phone].  No protocol assigned to this medication the way it is submitted with Dr. Dorris Fetch under [facility] [employer].     Thanks.

## 2020-02-20 MED ORDER — FAMOTIDINE 20 MG PO TABS: 20.0000 mg | ORAL_TABLET | Freq: Two times a day (BID) | ORAL | 3 refills | Status: DC

## 2020-02-26 ENCOUNTER — Encounter: Payer: Self-pay | Admitting: *Deleted

## 2020-02-28 ENCOUNTER — Ambulatory Visit: Payer: 59 | Admitting: Nurse Practitioner

## 2020-04-05 ENCOUNTER — Other Ambulatory Visit: Payer: Self-pay

## 2020-04-05 DIAGNOSIS — K920 Hematemesis: Secondary | ICD-10-CM | POA: Insufficient documentation

## 2020-04-05 DIAGNOSIS — R109 Unspecified abdominal pain: Secondary | ICD-10-CM | POA: Insufficient documentation

## 2020-04-05 LAB — CBC
HCT: 41.6 % (ref 36.0–46.0)
Hemoglobin: 14.2 g/dL (ref 12.0–15.0)
MCH: 29.3 pg (ref 26.0–34.0)
MCHC: 34.1 g/dL (ref 30.0–36.0)
MCV: 85.8 fL (ref 80.0–100.0)
Platelets: 445 10*3/uL — ABNORMAL HIGH (ref 150–400)
RBC: 4.85 MIL/uL (ref 3.87–5.11)
RDW: 12.6 % (ref 11.5–15.5)
WBC: 17 10*3/uL — ABNORMAL HIGH (ref 4.0–10.5)
nRBC: 0 % (ref 0.0–0.2)

## 2020-04-05 LAB — COMPREHENSIVE METABOLIC PANEL
ALT: 20 U/L (ref 0–44)
AST: 19 U/L (ref 15–41)
Albumin: 5.1 g/dL — ABNORMAL HIGH (ref 3.5–5.0)
Alkaline Phosphatase: 67 U/L (ref 38–126)
Anion gap: 15 (ref 5–15)
BUN: 12 mg/dL (ref 6–20)
CO2: 22 mmol/L (ref 22–32)
Calcium: 9.8 mg/dL (ref 8.9–10.3)
Chloride: 103 mmol/L (ref 98–111)
Creatinine, Ser: 0.73 mg/dL (ref 0.44–1.00)
GFR calc Af Amer: >60 mL/min (ref >60)
GFR calc non Af Amer: >60 mL/min (ref >60)
Glucose, Bld: 115 mg/dL — ABNORMAL HIGH (ref 70–99)
Potassium: 3 mmol/L — ABNORMAL LOW (ref 3.5–5.1)
Sodium: 140 mmol/L (ref 135–145)
Total Bilirubin: 1.1 mg/dL (ref 0.3–1.2)
Total Protein: 8.5 g/dL — ABNORMAL HIGH (ref 6.5–8.1)

## 2020-04-05 LAB — LIPASE, BLOOD: Lipase: 27 U/L (ref 11–51)

## 2020-04-05 NOTE — ED Triage Notes (Signed)
Patient reports reflux, and vomiting blood for several days.  Reports this is an issue that had been ongoing off/on for the past year.

## 2020-04-06 ENCOUNTER — Emergency Department: Payer: 59

## 2020-04-06 ENCOUNTER — Emergency Department
Admission: EM | Admit: 2020-04-06 | Discharge: 2020-04-06 | Disposition: A | Discharge status: Home / Self Care | Payer: 59 | Attending: Emergency Medicine | Admitting: Emergency Medicine

## 2020-04-06 ENCOUNTER — Encounter: Payer: Self-pay | Admitting: Radiology

## 2020-04-06 DIAGNOSIS — E876 Hypokalemia: Secondary | ICD-10-CM

## 2020-04-06 DIAGNOSIS — K920 Hematemesis: Secondary | ICD-10-CM

## 2020-04-06 DIAGNOSIS — R109 Unspecified abdominal pain: Secondary | ICD-10-CM

## 2020-04-06 DIAGNOSIS — R112 Nausea with vomiting, unspecified: Secondary | ICD-10-CM

## 2020-04-06 LAB — URINALYSIS, COMPLETE (UACMP) WITH MICROSCOPIC
Bacteria, UA: NONE SEEN
Bilirubin Urine: NEGATIVE
Glucose, UA: NEGATIVE mg/dL
Ketones, ur: 80 mg/dL — AB
Leukocytes,Ua: NEGATIVE
Nitrite: NEGATIVE
Protein, ur: 30 mg/dL — AB
Specific Gravity, Urine: 1.032 — ABNORMAL HIGH (ref 1.005–1.030)
pH: 5 (ref 5.0–8.0)

## 2020-04-06 LAB — URINE DRUG SCREEN, QUALITATIVE (ARMC ONLY)
Amphetamines, Ur Screen: NOT DETECTED
Barbiturates, Ur Screen: NOT DETECTED
Benzodiazepine, Ur Scrn: NOT DETECTED
Cannabinoid 50 Ng, Ur ~~LOC~~: POSITIVE — AB
Cocaine Metabolite,Ur ~~LOC~~: NOT DETECTED
MDMA (Ecstasy)Ur Screen: NOT DETECTED
Methadone Scn, Ur: NOT DETECTED
Opiate, Ur Screen: NOT DETECTED
Phencyclidine (PCP) Ur S: NOT DETECTED
Tricyclic, Ur Screen: NOT DETECTED

## 2020-04-06 LAB — HCG, QUANTITATIVE, PREGNANCY: hCG, Beta Chain, Quant, S: <1 m[IU]/mL (ref <5)

## 2020-04-06 LAB — POCT PREGNANCY, URINE: Preg Test, Ur: NEGATIVE

## 2020-04-06 MED ORDER — ONDANSETRON HCL 4 MG/2ML IJ SOLN
4.0000 mg | Freq: Once | INTRAMUSCULAR | Status: AC
  Administered 2020-04-06: 4 mg via INTRAVENOUS
  Filled 2020-04-06: qty 2

## 2020-04-06 MED ORDER — POTASSIUM CHLORIDE CRYS ER 20 MEQ PO TBCR
40.0000 meq | EXTENDED_RELEASE_TABLET | Freq: Once | ORAL | Status: AC
  Administered 2020-04-06: 40 meq via ORAL
  Filled 2020-04-06: qty 2

## 2020-04-06 MED ORDER — IOHEXOL 9 MG/ML PO SOLN
500.0000 mL | Freq: Two times a day (BID) | ORAL | Status: DC | PRN
  Administered 2020-04-06 (×2): 500 mL via ORAL

## 2020-04-06 MED ORDER — SODIUM CHLORIDE 0.9 % IV BOLUS
1000.0000 mL | Freq: Once | INTRAVENOUS | Status: AC
  Administered 2020-04-06: 1000 mL via INTRAVENOUS

## 2020-04-06 MED ORDER — ONDANSETRON HCL 4 MG PO TABS: 4.0000 mg | ORAL_TABLET | Freq: Every day | ORAL | 0 refills | Status: AC | PRN

## 2020-04-06 MED ORDER — SODIUM CHLORIDE 0.9 % IV BOLUS
500.0000 mL | Freq: Once | INTRAVENOUS | Status: AC
  Administered 2020-04-06: 500 mL via INTRAVENOUS

## 2020-04-06 MED ORDER — PANTOPRAZOLE SODIUM 40 MG IV SOLR
40.0000 mg | Freq: Once | INTRAVENOUS | Status: AC
  Administered 2020-04-06: 40 mg via INTRAVENOUS
  Filled 2020-04-06: qty 40

## 2020-04-06 MED ORDER — POTASSIUM CHLORIDE 10 MEQ/100ML IV SOLN
10.0000 meq | Freq: Once | INTRAVENOUS | Status: AC
  Administered 2020-04-06: 10 meq via INTRAVENOUS
  Filled 2020-04-06: qty 100

## 2020-04-06 MED ORDER — IOHEXOL 300 MG/ML  SOLN
100.0000 mL | Freq: Once | INTRAMUSCULAR | Status: AC | PRN
  Administered 2020-04-06: 100 mL via INTRAVENOUS

## 2020-04-06 NOTE — ED Notes (Addendum)
EDP at bedside; pt c/o vomiting bright red blood, and diffuse abdominal pain  Pt reports taking dramamine at 1630 that may have exacerbated the abdominal pain  Pt reports hx of reflux, LMP att, GI sensitivities since childhood  BM last sat this was unformed  Pt reports drinking expired apple juice Friday

## 2020-04-06 NOTE — ED Notes (Signed)
Pt in lobby, updated on delay.

## 2020-04-06 NOTE — Discharge Instructions (Signed)
Follow-up with your primary care physician to ensure your symptoms are improving. You would benefit from blood being drawn in the next 1 week to ensure your potassium level is normalized.  Return to the ED with any fevers or worsening symptoms.

## 2020-04-06 NOTE — ED Provider Notes (Signed)
[facility] Emergency Department Provider Note   ____________________________________________   First MD Initiated Contact with Patient 04/06/20 0402     (approximate)  I have reviewed the triage vital signs and the nursing notes.   HISTORY  Chief Complaint Abdominal Pain and Emesis    HPI Yolanda Gardner is a 24 y.o. female who comes in complaining of diffuse abdominal pain and vomiting.  She has been vomiting a lot and now is vomiting blood.  It is bright red at times.  She is not lightheaded.  She has had episodes of vomiting in the past and has had an upper GI endoscopy and hepatobiliary scan both which were unrevealing.  Her white blood count today is elevated and this has not been nearly this elevated before.  Patient also reports she is having her menstrual period and feels this may be making her lower abdomen achy.     Past Medical History:  Diagnosis Date  . Anxiety   . Aortic atherosclerosis (HCC)   . Depression   . Sinus infection   . Tendonitis     Patient Active Problem List   Diagnosis Date Noted  . Generalized anxiety disorder with panic attacks 01/01/2020  . Class 1 obesity due to excess calories without serious comorbidity with body mass index (BMI) of 34.0 to 34.9 in adult 03/29/2018  . Vitamin D deficiency 05/25/2015  . Jaw pain 05/25/2015  . Arm pain, right 05/25/2015  . Depression, major, recurrent (HCC) 04/24/2015    Past Surgical History:  Procedure Laterality Date  . WISDOM TOOTH EXTRACTION      Prior to Admission medications   Medication Sig Start Date End Date Taking? Authorizing Provider  Cholecalciferol (VITAMIN D PO) Take by mouth.    [provider]  escitalopram (LEXAPRO) 20 MG tablet Take 1 tablet (20 mg total) by mouth daily. 01/01/20   Jamelle Haring, MD  famotidine (PEPCID) 20 MG tablet Take 1 tablet (20 mg total) by mouth 2 (two) times daily. 02/20/20   Jamelle Haring, MD    hydrOXYzine (ATARAX/VISTARIL) 10 MG tablet Take 1 tablet (10 mg total) by mouth 3 (three) times daily as needed. 01/01/20   Jamelle Haring, MD  Multiple Vitamins-Minerals (WOMENS MULTIVITAMIN PO) Take by mouth.    [provider]  ondansetron (ZOFRAN) 4 MG tablet Take 1 tablet (4 mg total) by mouth every 6 (six) hours. 01/28/20   Couture, Cortni S, PA-C    Allergies Patient has no known allergies.  Family History  Problem Relation Age of Onset  . Depression Mother   . Hypertension Maternal Grandmother   . Diabetes Maternal Aunt   . Stomach cancer Neg Hx   . Colon cancer Neg Hx   . Esophageal cancer Neg Hx   . Pancreatic cancer Neg Hx     Social History Social History   Tobacco Use  . Smoking status: Never Smoker  . Smokeless tobacco: Never Used  Vaping Use  . Vaping Use: Never used  Substance Use Topics  . Alcohol use: Yes    Comment: Social  . Drug use: Yes    Types: Marijuana    Comment: 1 x a week    Review of Systems  Constitutional: No fever/chills Eyes: No visual changes. ENT: No sore throat. Cardiovascular: Denies chest pain. Respiratory: Denies shortness of breath. Gastrointestinal: abdominal pain.  nausea, vomiting.  No diarrhea.  No constipation Genitourinary: Negative for dysuria. Musculoskeletal: Negative for back pain. Skin: Negative  for rash. Neurological: Negative for headaches, focal weakness   ____________________________________________   PHYSICAL EXAM:  VITAL SIGNS: ED Triage Vitals  Enc Vitals Group     BP 04/05/20 1916 130/74     Pulse Rate 04/05/20 1916 87     Resp 04/05/20 1916 18     Temp 04/05/20 1916 (!) 100.4 F (38 C)     Temp Source 04/05/20 1916 Oral     SpO2 04/05/20 1916 99 %     Weight 04/05/20 1916 190 lb (86.2 kg)     Height 04/05/20 1916 5\' 5"  (1.651 m)     Head Circumference --      Peak Flow --      Pain Score 04/05/20 1920 8     Pain Loc --      Pain Edu? --      Excl. in GC? --      Constitutional: Alert and oriented.  Looks tired. Eyes: Conjunctivae are normal.  Head: Atraumatic. Nose: No congestion/rhinnorhea. Mouth/Throat: Mucous membranes are moist.  Oropharynx non-erythematous. Neck: No stridor. Cardiovascular: Normal rate, regular rhythm. Grossly normal heart sounds.  Good peripheral circulation. Respiratory: Normal respiratory effort.  No retractions. Lungs CTAB. Gastrointestinal: Soft diffusely tender to palpation no distention. No abdominal bruits. No CVA tenderness. Musculoskeletal: No lower extremity tenderness nor edema.  No joint effusions. Neurologic:  Normal speech and language. No gross focal neurologic deficits are appreciated. No gait instability. Skin:  Skin is warm, dry and intact. No rash noted.   ____________________________________________   LABS (all labs ordered are listed, but only abnormal results are displayed)  Labs Reviewed  COMPREHENSIVE METABOLIC PANEL - Abnormal; Notable for the following components:      Result Value   Potassium 3.0 (*)    Glucose, Bld 115 (*)    Total Protein 8.5 (*)    Albumin 5.1 (*)    All other components within normal limits  CBC - Abnormal; Notable for the following components:   WBC 17.0 (*)    Platelets 445 (*)    All other components within normal limits  URINALYSIS, COMPLETE (UACMP) WITH MICROSCOPIC - Abnormal; Notable for the following components:   Color, Urine YELLOW (*)    APPearance TURBID (*)    Specific Gravity, Urine 1.032 (*)    Hgb urine dipstick LARGE (*)    Ketones, ur 80 (*)    Protein, ur 30 (*)    All other components within normal limits  URINE DRUG SCREEN, QUALITATIVE ([facility] ONLY) - Abnormal; Notable for the following components:   Cannabinoid 50 Ng, Ur [place] POSITIVE (*)    All other components within normal limits  LIPASE, BLOOD  HCG, QUANTITATIVE, PREGNANCY  POC URINE PREG, ED  POCT PREGNANCY, URINE    ____________________________________________  EKG  ____________________________________________  RADIOLOGY  ED MD interpretation  Official radiology report(s): DG Chest Portable 1 View  Result Date: 04/06/2020 CLINICAL DATA:  Vomiting blood for several days. EXAM: PORTABLE CHEST 1 VIEW COMPARISON:  None. FINDINGS: The cardiac silhouette, mediastinal and hilar contours are normal. The lungs are clear. No pleural effusions. The bony thorax is intact. IMPRESSION: No acute cardiopulmonary findings. Electronically Signed   By: 06/06/2020 M.D.   On: 04/06/2020 05:01    ____________________________________________   PROCEDURES  Procedure(s) performed (including Critical Care):  Procedures   ____________________________________________   INITIAL IMPRESSION / ASSESSMENT AND PLAN / ED COURSE      Pt signed out to Dr D. Smith pending ct scan results  ____________________________________________   FINAL CLINICAL IMPRESSION(S) / ED DIAGNOSES  Final diagnoses:  Non-intractable vomiting with nausea, unspecified vomiting type  Abdominal pain, unspecified abdominal location  Hematemesis with nausea     ED Discharge Orders    None       Note:  This document was prepared using Dragon voice recognition software and may include unintentional dictation errors.    Arnaldo Natal, MD 04/06/20 [phone]

## 2020-04-06 NOTE — ED Notes (Signed)
CT called with results

## 2020-04-06 NOTE — ED Notes (Addendum)
CT called - pt only able to drink one bottle "feeling full"

## 2020-04-21 NOTE — Progress Notes (Signed)
Patient ID: Yolanda Gardner, female    DOB: [birth year], 24 y.o.   MRN: [id number]  PCP: Yolanda Haring, MD  Chief Complaint  Patient presents with  . Gastroesophageal Reflux    has been monitoring food and trying to stay away from foods that trigger her vomitting ,recently was seen at ED for blood in vomit    Subjective:   Yolanda Gardner is a 24 y.o. female, presents to clinic with CC of the following:  Chief Complaint  Patient presents with  . Gastroesophageal Reflux    has been monitoring food and trying to stay away from foods that trigger her vomitting ,recently was seen at ED for blood in vomit    HPI:  Patient is a 24 year old female Last visit with me was 01/01/2020 for depression/anxiety/obesity with that note reviewed Follows up today with continuing abdominal/upper back pain and intermittent vomiting.  She was seen in the emergency room 01/28/2020 with the following complaint and assessment/plan:  24 year old female presenting for evaluation of epigastric abdominal pain ongoing intermittently for several months but worse over the last week.  Associate with some nausea vomiting.  Is worse with certain foods.  Reviewed/interpreted CBC is without leukocytosis or anemia CMP with mild hypokalemia, normal kidney function.  Liver enzymes are normal.  Bilirubin is slightly elevated. Lipase is negative Beta-hCG negative UA is without evidence of UTI, but does show some ketonuria consistent with decreased p.o. intake and likely mild dehydration.  Given patient's bilirubin is slightly elevated, has epigastric pain and seems to have worse pain with eating, will obtain right upper [place] ultrasound  Right upper quadrant ultrasound showed normal gallbladder without any evidence of cholecystitis.  Patient given IV fluids, Pepcid, Zofran, Bentyl, Carafate and GI cocktail.  On reassessment she states she feels much improved.  She has been able to tolerate p.o.   Suspect symptoms may be related to acid reflux versus gastritis.  Will treat with Pepcid.  We will also give Rx for Zofran for home.  Given prolonged symptoms, will have her follow-up with GI.   She followed up on 02/07/2020 with gastroenterology with the following assessment/plan:   24 year old female with N/V an epigastric pain. Normal abdominal sonogram. K+ 3.3. T. Bili 1.5. with normal LFTs.  -EGD benefits and risks discussed including risk with sedation, risk of bleeding, perforation and infection  -BMP, hepatic panel  -IF EGD negative, I will order a CCK HIDA -Patient to call our office if he symptoms worsen   2. GERD -Pepcid 20mg  daily  -GERD handout -See plan in # 1   3. Anxiety and depression on Lexapro as followed by Dr.  The EGD on 02/11/2020 was negative The CCK HIDA scan on 02/18/2020 with normal  She returned to the emergency room 04/06/2020 with the following:  Yolanda Gardner is a 24 y.o. female who comes in complaining of diffuse abdominal pain and vomiting.  She has been vomiting a lot and now is vomiting blood.  It is bright red at times.  She is not lightheaded.  She has had episodes of vomiting in the past and has had an upper GI endoscopy and hepatobiliary scan both which were unrevealing.  Her white blood count today is elevated and this has not been nearly this elevated before.  Patient also reports she is having her menstrual period and feels this may be making her lower abdomen achy. Her CT of the abdomen and pelvis and her chest  x-ray were unremarkable during that visit. Lab results from that visit were reviewed (high white blood count at 17, slightly increased platelet count, low potassium at 3, positive cannabinoid on urine drug screen noted) She was discharged  Follows up today after the above emergency room visit. She notes her symptoms have significantly improved, still occasionally has some pains intermittently and felt in the mid back  area/epigastric area at times after eating spicy foods, or foods that are seasoned.  She notes garlic and onions are problematic.  She has tried to avoid these in the recent past which has been helpful.  At times in the morning when she gets up she will have some discomfort at times, not marked.  No further vomiting. Taking pepcid once daily now, had been twice daily. She noted before she went to the emergency room, she had a very fatty meal a day or 2 prior and thinks that might have been what prompted her symptoms and that visit.  I did review the work-up above from the gastroenterologist, with her instructions to call the office if her symptoms worsen.  Noted if symptoms are again more problematic, will want to do so.  Tobacco-never smoker Alcohol-social  Patient Active Problem List   Diagnosis Date Noted  . Generalized anxiety disorder with panic attacks 01/01/2020  . Class 1 obesity due to excess calories without serious comorbidity with body mass index (BMI) of 34.0 to 34.9 in adult 03/29/2018  . Vitamin D deficiency 05/25/2015  . Jaw pain 05/25/2015  . Arm pain, right 05/25/2015  . Depression, major, recurrent (HCC) 04/24/2015      Current Outpatient Medications:  .  escitalopram (LEXAPRO) 20 MG tablet, Take 1 tablet (20 mg total) by mouth daily., Disp: 90 tablet, Rfl: 1 .  famotidine (PEPCID) 20 MG tablet, Take 1 tablet (20 mg total) by mouth 2 (two) times daily., Disp: 60 tablet, Rfl: 3 .  hydrOXYzine (ATARAX/VISTARIL) 10 MG tablet, Take 1 tablet (10 mg total) by mouth 3 (three) times daily as needed., Disp: 30 tablet, Rfl: 2 .  Multiple Vitamins-Minerals (WOMENS MULTIVITAMIN PO), Take by mouth., Disp: , Rfl:  .  ondansetron (ZOFRAN) 4 MG tablet, Take 1 tablet (4 mg total) by mouth every 6 (six) hours., Disp: 12 tablet, Rfl: 0 .  ondansetron (ZOFRAN) 4 MG tablet, Take 1 tablet (4 mg total) by mouth daily as needed for nausea or vomiting., Disp: 10 tablet, Rfl: 0   No Known  Allergies   Past Surgical History:  Procedure Laterality Date  . WISDOM TOOTH EXTRACTION       Family History  Problem Relation Age of Onset  . Depression Mother   . Hypertension Maternal Grandmother   . Diabetes Maternal Aunt   . Stomach cancer Neg Hx   . Colon cancer Neg Hx   . Esophageal cancer Neg Hx   . Pancreatic cancer Neg Hx      Social History   Tobacco Use  . Smoking status: Never Smoker  . Smokeless tobacco: Never Used  Substance Use Topics  . Alcohol use: Yes    Comment: Social    With staff assistance, above reviewed with the patient today.  ROS: As per HPI, otherwise no specific complaints on a limited and focused system review   No results found for this or any previous visit (from the past 72 hour(s)).   PHQ2/9: Depression screen [facility] 2/9 04/22/2020 01/01/2020 04/30/2019 03/29/2018 02/26/2018  Decreased Interest 0 1 1 0 0  Down, Depressed,  Hopeless 1 1 1  0 0  PHQ - 2 Score 1 2 2  0 0  Altered sleeping 0 1 1 1  0  Tired, decreased energy 1 1 1 1  0  Change in appetite 0 0 1 0 0  Feeling bad or failure about yourself  1 0 1 0 0  Trouble concentrating - 1 1 0 0  Moving slowly or fidgety/restless 0 1 1 0 0  Suicidal thoughts 0 0 0 0 0  PHQ-9 Score 3 6 8 2  0  Difficult doing work/chores Somewhat difficult Somewhat difficult Somewhat difficult - -   PHQ-2/9 Result reviewed  Fall Risk: Fall Risk  04/22/2020 01/01/2020 04/30/2019 03/29/2018 03/29/2018  Falls in the past year? 0 0 0 No No  Number falls in past yr: 0 0 0 - -  Injury with Fall? 0 0 0 - -  Follow up Falls evaluation completed Falls evaluation completed Falls evaluation completed - -      Objective:   Vitals:   04/22/20 1103  BP: 120/78  Pulse: 100  Resp: 16  Temp: 98.3 F (36.8 C)  TempSrc: Oral  SpO2: 98%  Weight: 185 lb 14.4 oz (84.3 kg)  Height: 5\' 5"  (1.651 m)    Body mass index is 30.94 kg/m.  Physical Exam   NAD, masked, looks well, pleasant HEENT - /AT, sclera anicteric,  PERRL, conj - non-inj'ed, pharynx clear Neck - supple, no adenopathy, no rigidity Car - RRR without m/g/r, heart rate on my exam approximately 90 and regular Pulm- RR and effort normal at rest, CTA without wheeze or rales Abd - soft, NT, mildly obese, ND, BS+,  no masses, no obvious HSM Back - no CVA tenderness Ext - no LE edema, Neuro/psychiatric - affect was not flat, appropriate with conversation  Alert with normal speech  Grossly non-focal    Results for orders placed or performed during the hospital encounter of 04/06/20  Lipase, blood  Result Value Ref Range   Lipase 27 11 - 51 U/L  Comprehensive metabolic panel  Result Value Ref Range   Sodium 140 135 - 145 mmol/L   Potassium 3.0 (L) 3.5 - 5.1 mmol/L   Chloride 103 98 - 111 mmol/L   CO2 22 22 - 32 mmol/L   Glucose, Bld 115 (H) 70 - 99 mg/dL   BUN 12 6 - 20 mg/dL   Creatinine, Ser 06/30/2019 0.44 - 1.00 mg/dL   Calcium 9.8 8.9 - 05/29/2018 mg/dL   Total Protein 8.5 (H) 6.5 - 8.1 g/dL   Albumin 5.1 (H) 3.5 - 5.0 g/dL   AST 19 15 - 41 U/L   ALT 20 0 - 44 U/L   Alkaline Phosphatase 67 38 - 126 U/L   Total Bilirubin 1.1 0.3 - 1.2 mg/dL   GFR calc non Af Amer >60 >60 mL/min   GFR calc Af Amer >60 >60 mL/min   Anion gap 15 5 - 15  CBC  Result Value Ref Range   WBC 17.0 (H) 4.0 - 10.5 K/uL   RBC 4.85 3.87 - 5.11 MIL/uL   Hemoglobin 14.2 12.0 - 15.0 g/dL   HCT 05/29/2018 36 - 46 %   MCV 85.8 80.0 - 100.0 fL   MCH 29.3 26.0 - 34.0 pg   MCHC 34.1 30.0 - 36.0 g/dL   RDW 04/24/20 - 06/06/20 %   Platelets 445 (H) 150 - 400 K/uL   nRBC 0.0 0.0 - 0.2 %  Urinalysis, Complete w Microscopic Urine, Clean Catch  Result Value Ref Range   Color, Urine YELLOW (A) YELLOW   APPearance TURBID (A) CLEAR   Specific Gravity, Urine 1.032 (H) 1.005 - 1.030   pH 5.0 5.0 - 8.0   Glucose, UA NEGATIVE NEGATIVE mg/dL   Hgb urine dipstick LARGE (A) NEGATIVE   Bilirubin Urine NEGATIVE NEGATIVE   Ketones, ur 80 (A) NEGATIVE mg/dL   Protein, ur 30 (A) NEGATIVE  mg/dL   Nitrite NEGATIVE NEGATIVE   Leukocytes,Ua NEGATIVE NEGATIVE   RBC / HPF 21-50 0 - 5 RBC/hpf   WBC, UA 6-10 0 - 5 WBC/hpf   Bacteria, UA NONE SEEN NONE SEEN   Squamous Epithelial / LPF 0-5 0 - 5   Mucus PRESENT   Urine Drug Screen, Qualitative  Result Value Ref Range   Tricyclic, Ur Screen NONE DETECTED NONE DETECTED   Amphetamines, Ur Screen NONE DETECTED NONE DETECTED   MDMA (Ecstasy)Ur Screen NONE DETECTED NONE DETECTED   Cocaine Metabolite,Ur [place] NONE DETECTED NONE DETECTED   Opiate, Ur Screen NONE DETECTED NONE DETECTED   Phencyclidine (PCP) Ur S NONE DETECTED NONE DETECTED   Cannabinoid 50 Ng, Ur [place] POSITIVE (A) NONE DETECTED   Barbiturates, Ur Screen NONE DETECTED NONE DETECTED   Benzodiazepine, Ur Scrn NONE DETECTED NONE DETECTED   Methadone Scn, Ur NONE DETECTED NONE DETECTED  hCG, quantitative, pregnancy  Result Value Ref Range   hCG, Beta Chain, Quant, S <1 <5 mIU/mL  Pregnancy, urine POC  Result Value Ref Range   Preg Test, Ur NEGATIVE NEGATIVE   Labs from recent ER visit reviewed    Assessment & Plan:   1. Encounter for examination following treatment at hospital Discussed possibly repeating the labs today with the low potassium noted in the ER visit as well as the higher white count, although noted had sources for these abnormalities, and mutually agreed to hold off on rechecking labs today as she has been feeling much improved.  2. Gastroesophageal reflux disease without esophagitis Notes has been more cognizant of triggers and foods to avoid, and has been helpful. Has been taking the Pepcid nightly, and given some morning symptoms noted, and also the recent events, recommended increasing to twice a day presently. Discussed the PPIs, and the possibility of changing to 1 for better control over time if symptoms are not resolving or a little more problematic, will hold off on making that transition today as did discuss some of the concerns with chronic  long-term use of PPIs as well.  She seems to be doing pretty well with the Pepcid product and will increase that to twice a day presently  3. Generalized anxiety disorder with panic attacks 4. Recurrent major depressive disorder, in partial remission (HCC) On Lexapro, and stable in the recent past  5. Class 1 obesity due to excess calories with serious comorbidity and body mass index (BMI) of 30.0 to 30.9 in adult Wt Readings from Last 3 Encounters:  04/22/20 185 lb 14.4 oz (84.3 kg)  04/05/20 190 lb (86.2 kg)  02/11/20 198 lb (89.8 kg)   She has had some success losing weight in the recent past, and that also will be helpful with her reflux over time.   If symptoms persisting or more problematic over time, will follow-up and discuss potentially transitioning to a PPI chronically, and also noted having a low yield to get gastroenterology involved again as needed.  She was very much in agreement with the plans today.     Yolanda HaringLIFFORD D Miles Leyda, MD 04/22/20  11:19 AM

## 2020-04-22 ENCOUNTER — Ambulatory Visit (INDEPENDENT_AMBULATORY_CARE_PROVIDER_SITE_OTHER): Payer: 59 | Admitting: Internal Medicine

## 2020-04-22 ENCOUNTER — Other Ambulatory Visit: Payer: Self-pay

## 2020-04-22 ENCOUNTER — Encounter: Payer: Self-pay | Admitting: Internal Medicine

## 2020-04-22 VITALS — BP 120/78 | HR 100 | Temp 98.3°F | Resp 16 | Ht 65.0 in | Wt 185.9 lb

## 2020-04-22 DIAGNOSIS — Z6830 Body mass index (BMI) 30.0-30.9, adult: Secondary | ICD-10-CM

## 2020-04-22 DIAGNOSIS — Z09 Encounter for follow-up examination after completed treatment for conditions other than malignant neoplasm: Secondary | ICD-10-CM | POA: Diagnosis not present

## 2020-04-22 DIAGNOSIS — F411 Generalized anxiety disorder: Secondary | ICD-10-CM

## 2020-04-22 DIAGNOSIS — K219 Gastro-esophageal reflux disease without esophagitis: Secondary | ICD-10-CM

## 2020-04-22 DIAGNOSIS — F3341 Major depressive disorder, recurrent, in partial remission: Secondary | ICD-10-CM | POA: Diagnosis not present

## 2020-04-22 DIAGNOSIS — F410 Panic disorder [episodic paroxysmal anxiety] without agoraphobia: Secondary | ICD-10-CM

## 2020-04-22 DIAGNOSIS — E6609 Other obesity due to excess calories: Secondary | ICD-10-CM

## 2020-04-22 NOTE — Patient Instructions (Signed)

## 2020-06-30 ENCOUNTER — Other Ambulatory Visit: Payer: Self-pay | Admitting: Internal Medicine

## 2020-06-30 DIAGNOSIS — F3341 Major depressive disorder, recurrent, in partial remission: Secondary | ICD-10-CM

## 2020-07-03 ENCOUNTER — Ambulatory Visit: Payer: Self-pay | Admitting: Internal Medicine

## 2020-07-03 NOTE — Progress Notes (Deleted)
Patient is a 24 year old female Follows up today after our May visit She had been seen more recently to follow-up after emergency room visits, with our last visit 04/22/2020. At that time, her Pepcid was increased to twice daily to help with better reflux control.    GERD - Notes has been more cognizant of triggers and foods to avoid, and has been helpful.  Medication regimen-Pepcid bid, .  On previous visit, discussed the PPIs, and the possibility of changing to 1 for better control over time if symptoms are not resolving or a little more problematic  Denies black or dark stools, bleeding per rectum, chronic abdominal pains  Depression/Anxiety:  Medication regiment - lexapro 20mg ; hydroxyzine 10 mg prn for anxiety,  She notes that she did have some episodes of SI prior to increasing to 20mg , she denies any thoughts of hurting self or others in recent past, she thinks medications have been very helpful  She notes that her anxiety increase was a lot due to COVID-19 pandemic in general. 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 She has had a few panic attacks in her past (since started college),   She is not currently going to counseling,   Denies any CP, SOB, rare palp's noted and none in the recent months,   She was seen in the emergency room 10/23/2019 with nausea and vomiting. An ECG done at that time was noted to have a prolonged QTc, and it was advised that she may need to cut back on the Lexapro and eventually stop it due to this.  It was recommended she call her PCP who was at that time to inquire about cutting back on Lexapro or changing the medication.  She has not had any follow-up since.  Obesity: She is trying to exercise almost daily, often walks the dog.  She noted she lost some weight with the stomach symptoms she had that she went to the emergency room for prior to our last  appointment,  has continued to watch her diet both in quality and quantity Wt Readings from Last 3 Encounters:  04/22/20 185 lb 14.4 oz (84.3 kg)  04/05/20 190 lb (86.2 kg)  02/11/20 198 lb (89.8 kg)   No tob use, alcohol denied since recent ER visit, not heavy prior    NAD, masked, pleasant HEENT - [place]/AT, sclera anicteric, PERRL, EOMI, conj - non-inj'ed, pharynx clear Neck - supple, no adenopathy, no TM, carotids 2+ and = without bruits bilat Car - RRR without m/g/r Pulm- RR and effort normal at rest, CTA without wheeze or rales Abd - soft, NT, ND, BS+,  no masses, no hepatosplenomegaly Back - no CVA tenderness Ext - no LE edema, Neuro/psychiatric - affect was not flat, appropriate with conversation             Alert and oriented             Grossly non-focal - good strength on testing extremities, sensation intact to LT in distal extremities, DTRs 2-3+ and equal in the patella, good balance on 1 foot,             Speech and gait are normal    1. Recurrent major depressive disorder, in partial remission (HCC) Remains stable. We will continue the Lexapro, as she notes it has been very helpful. She also will be contacting counseling here shortly to have them involved as  I noted that can only be helpful in addition - escitalopram (LEXAPRO) 20 MG tablet; Take 1 tablet (20 mg total) by mouth daily.  Dispense: 90 tablet; Refill: 1 - hydrOXYzine (ATARAX/VISTARIL) 10 MG tablet; Take 1 tablet (10 mg total) by mouth 3 (three) times daily as needed.  Dispense: 30 tablet; Refill: 2  2. Generalized anxiety disorder with panic attacks As above, remains stable Continue the Lexapro and the hydroxyzine as needed. Counseling soon to be initiated in combination is planned  3. Class 1 obesity due to excess calories without serious comorbidity with body mass index (BMI) of 34.0 to 34.9 in adult Continue with regular exercise and dietary modifications to help with weight management  encouraged.    Reviewed the recent labs from February, 2021 and her kidney function and liver function tests were normal as part of that. She still takes her vitamin D supplement, and to continue. She did have the Covid vaccination as well  Can reccheck labs on a f/u in 5-6 months, unless wants to check or concerns with visit

## 2020-07-06 ENCOUNTER — Ambulatory Visit: Payer: Self-pay | Admitting: Internal Medicine

## 2020-07-07 ENCOUNTER — Encounter: Payer: Self-pay | Admitting: Internal Medicine

## 2020-07-07 ENCOUNTER — Ambulatory Visit (INDEPENDENT_AMBULATORY_CARE_PROVIDER_SITE_OTHER): Payer: 59 | Admitting: Internal Medicine

## 2020-07-07 ENCOUNTER — Other Ambulatory Visit: Payer: Self-pay

## 2020-07-07 VITALS — BP 90/50 | HR 90 | Temp 98.5°F | Resp 16 | Ht 65.0 in | Wt 184.3 lb

## 2020-07-07 DIAGNOSIS — F411 Generalized anxiety disorder: Secondary | ICD-10-CM

## 2020-07-07 DIAGNOSIS — E6609 Other obesity due to excess calories: Secondary | ICD-10-CM | POA: Diagnosis not present

## 2020-07-07 DIAGNOSIS — F410 Panic disorder [episodic paroxysmal anxiety] without agoraphobia: Secondary | ICD-10-CM

## 2020-07-07 DIAGNOSIS — F3341 Major depressive disorder, recurrent, in partial remission: Secondary | ICD-10-CM

## 2020-07-07 DIAGNOSIS — Z6830 Body mass index (BMI) 30.0-30.9, adult: Secondary | ICD-10-CM

## 2020-07-07 DIAGNOSIS — K219 Gastro-esophageal reflux disease without esophagitis: Secondary | ICD-10-CM | POA: Diagnosis not present

## 2020-07-07 NOTE — Progress Notes (Signed)
Patient ID: Yolanda Gardner, female    DOB: 01/19/1996, 24 y.o.   MRN: [id number]  PCP: Jamelle HaringHendrickson, Bertine Schlottman D, MD  Chief Complaint  Patient presents with  . Follow-up    abdominal pain    Subjective:   Yolanda Gardner is a 24 y.o. female, presents to clinic with CC of the following:  Chief Complaint  Patient presents with  . Follow-up    abdominal pain    HPI:  Patient is a 24 year old female Follows up today after our May visit She had been seen more recently to follow-up after emergency room visits, with our last visit 04/22/2020. At that time, her Pepcid was increased to twice daily to help with better reflux control. She notes she has been doing very well here in the recent past               GERD - Notes has been more cognizant of triggers and foods to avoid, and has been helpful. Noted doing much better with the increase Pepcid to twice a day and she does not notice when she misses a dose here and there, she does have some mild symptoms.             Medication regimen-Pepcid bid, .             On previous visit, discussed the PPIs, and the possibility of changing to 1 for better control over time if symptoms are not resolving or a little more problematic.             Denies black or dark stools, bleeding per rectum, chronic abdominal pains, no N/V  Depression/Anxiety:  Medication regiment - lexapro 20mg ;hydroxyzine 10 mg prn for anxiety,  often takes the hydroxyzine at nighttime to help her sleep at times. She continues to think the medications have been helpful.  She notes that her anxietyincrease was a lotdueto COVID-19 pandemic in general.Denies increased in anxiety in recent past   Obesity: She is trying to exercisealmost daily,oftenwalks and plays with the dog.  She noted she lost some weight with the stomach symptoms she had that she went to the emergency room for  prior to our last appointment, and her weight has remained down. has  continued to watch her diet both in quality and quantity Wt Readings from Last 3 Encounters:  07/07/20 184 lb 4.8 oz (83.6 kg)  04/22/20 185 lb 14.4 oz (84.3 kg)  04/05/20 190 lb (86.2 kg)    No tob use,  She did have the Covid vaccination    Patient Active Problem List   Diagnosis Date Noted  . Gastroesophageal reflux disease without esophagitis 04/22/2020  . Generalized anxiety disorder with panic attacks 01/01/2020  . Class 1 obesity due to excess calories with serious comorbidity and body mass index (BMI) of 30.0 to 30.9 in adult 03/29/2018  . Vitamin D deficiency 05/25/2015  . Jaw pain 05/25/2015  . Depression, major, recurrent (HCC) 04/24/2015      Current Outpatient Medications:  .  escitalopram (LEXAPRO) 20 MG tablet, TAKE 1 TABLET(20 MG) BY MOUTH DAILY, Disp: 90 tablet, Rfl: 1 .  famotidine (PEPCID) 20 MG tablet, Take 1 tablet (20 mg total) by mouth 2 (two) times daily., Disp: 60 tablet, Rfl: 3 .  hydrOXYzine (ATARAX/VISTARIL) 10 MG tablet, Take 1 tablet (10 mg total) by mouth 3 (three) times daily as needed., Disp: 30 tablet, Rfl: 2 .  Multiple Vitamins-Minerals (WOMENS MULTIVITAMIN PO), Take by mouth., Disp: , Rfl:  .  ondansetron (ZOFRAN) 4 MG tablet, Take 1 tablet (4 mg total) by mouth every 6 (six) hours., Disp: 12 tablet, Rfl: 0 .  ondansetron (ZOFRAN) 4 MG tablet, Take 1 tablet (4 mg total) by mouth daily as needed for nausea or vomiting., Disp: 10 tablet, Rfl: 0   No Known Allergies   Past Surgical History:  Procedure Laterality Date  . WISDOM TOOTH EXTRACTION       Family History  Problem Relation Age of Onset  . Depression Mother   . Hypertension Maternal Grandmother   . Diabetes Maternal Aunt   . Stomach cancer Neg Hx   . Colon cancer Neg Hx   . Esophageal cancer Neg Hx   . Pancreatic cancer Neg Hx      Social History   Tobacco Use  . Smoking status: Never Smoker  . Smokeless tobacco: Never Used  Substance Use Topics  . Alcohol use: Yes     Comment: Social    With staff assistance, above reviewed with the patient today.  ROS: As per HPI, otherwise no specific complaints on a limited and focused system review   No results found for this or any previous visit (from the past 72 hour(s)).   PHQ2/9: Depression screen [facility] 2/9 07/07/2020 04/22/2020 01/01/2020 04/30/2019 03/29/2018  Decreased Interest 0 0 1 1 0  Down, Depressed, Hopeless 0 1 1 1  0  PHQ - 2 Score 0 1 2 2  0  Altered sleeping 0 0 1 1 1   Tired, decreased energy 1 1 1 1 1   Change in appetite 0 0 0 1 0  Feeling bad or failure about yourself  0 1 0 1 0  Trouble concentrating 0 - 1 1 0  Moving slowly or fidgety/restless 0 0 1 1 0  Suicidal thoughts 0 0 0 0 0  PHQ-9 Score 1 3 6 8 2   Difficult doing work/chores Not difficult at all Somewhat difficult Somewhat difficult Somewhat difficult -   PHQ-2/9 Result reviewed  Fall Risk: Fall Risk  07/07/2020 04/22/2020 01/01/2020 04/30/2019 03/29/2018  Falls in the past year? 0 0 0 0 No  Number falls in past yr: 0 0 0 0 -  Injury with Fall? 0 0 0 0 -  Follow up - Falls evaluation completed Falls evaluation completed Falls evaluation completed -      Objective:   Vitals:   07/07/20 1004  BP: (!) 90/50  Pulse: 90  Resp: 16  Temp: 98.5 F (36.9 C)  TempSrc: Oral  SpO2: 98%  Weight: 184 lb 4.8 oz (83.6 kg)  Height: 5\' 5"  (1.651 m)    Body mass index is 30.67 kg/m.  Physical Exam    NAD, masked,very pleasant HEENT - [place]/AT, sclera anicteric, PERRL, EOMI, conj - non-inj'ed, pharynx clear Neck - supple, no adenopathy, no TM,  Car - RRR without m/g/r Pulm- RR and effort normal at rest, CTA without wheeze or rales Abd - soft, NT, ND, BS+, no masses,no hepatosplenomegaly Back - no CVA tenderness Ext - no LE edema, Neuro/psychiatric - affect was not flat, appropriate with conversation Alert and oriented, speech normal  Results for orders placed or performed during the hospital encounter of 04/06/20    Lipase, blood  Result Value Ref Range   Lipase 27 11 - 51 U/L  Comprehensive metabolic panel  Result Value Ref Range   Sodium 140 135 - 145 mmol/L   Potassium 3.0 (L) 3.5 - 5.1 mmol/L   Chloride 103 98 - 111 mmol/L  CO2 22 22 - 32 mmol/L   Glucose, Bld 115 (H) 70 - 99 mg/dL   BUN 12 6 - 20 mg/dL   Creatinine, Ser 2.83 0.44 - 1.00 mg/dL   Calcium 9.8 8.9 - 66.2 mg/dL   Total Protein 8.5 (H) 6.5 - 8.1 g/dL   Albumin 5.1 (H) 3.5 - 5.0 g/dL   AST 19 15 - 41 U/L   ALT 20 0 - 44 U/L   Alkaline Phosphatase 67 38 - 126 U/L   Total Bilirubin 1.1 0.3 - 1.2 mg/dL   GFR calc non Af Amer >60 >60 mL/min   GFR calc Af Amer >60 >60 mL/min   Anion gap 15 5 - 15  CBC  Result Value Ref Range   WBC 17.0 (H) 4.0 - 10.5 K/uL   RBC 4.85 3.87 - 5.11 MIL/uL   Hemoglobin 14.2 12.0 - 15.0 g/dL   HCT 94.7 36 - 46 %   MCV 85.8 80.0 - 100.0 fL   MCH 29.3 26.0 - 34.0 pg   MCHC 34.1 30.0 - 36.0 g/dL   RDW 65.4 65.0 - 35.4 %   Platelets 445 (H) 150 - 400 K/uL   nRBC 0.0 0.0 - 0.2 %  Urinalysis, Complete w Microscopic Urine, Clean Catch  Result Value Ref Range   Color, Urine YELLOW (A) YELLOW   APPearance TURBID (A) CLEAR   Specific Gravity, Urine 1.032 (H) 1.005 - 1.030   pH 5.0 5.0 - 8.0   Glucose, UA NEGATIVE NEGATIVE mg/dL   Hgb urine dipstick LARGE (A) NEGATIVE   Bilirubin Urine NEGATIVE NEGATIVE   Ketones, ur 80 (A) NEGATIVE mg/dL   Protein, ur 30 (A) NEGATIVE mg/dL   Nitrite NEGATIVE NEGATIVE   Leukocytes,Ua NEGATIVE NEGATIVE   RBC / HPF 21-50 0 - 5 RBC/hpf   WBC, UA 6-10 0 - 5 WBC/hpf   Bacteria, UA NONE SEEN NONE SEEN   Squamous Epithelial / LPF 0-5 0 - 5   Mucus PRESENT   Urine Drug Screen, Qualitative  Result Value Ref Range   Tricyclic, Ur Screen NONE DETECTED NONE DETECTED   Amphetamines, Ur Screen NONE DETECTED NONE DETECTED   MDMA (Ecstasy)Ur Screen NONE DETECTED NONE DETECTED   Cocaine Metabolite,Ur [place] NONE DETECTED NONE DETECTED   Opiate, Ur Screen NONE DETECTED NONE  DETECTED   Phencyclidine (PCP) Ur S NONE DETECTED NONE DETECTED   Cannabinoid 50 Ng, Ur [place] POSITIVE (A) NONE DETECTED   Barbiturates, Ur Screen NONE DETECTED NONE DETECTED   Benzodiazepine, Ur Scrn NONE DETECTED NONE DETECTED   Methadone Scn, Ur NONE DETECTED NONE DETECTED  hCG, quantitative, pregnancy  Result Value Ref Range   hCG, Beta Chain, Quant, S <1 <5 mIU/mL  Pregnancy, urine POC  Result Value Ref Range   Preg Test, Ur NEGATIVE NEGATIVE       Assessment & Plan:    1. Recurrent major depressive disorder, in partial remission (HCC) Remains stable. will continue the Lexapro, as she notes it has been very helpful.  2. Generalized anxiety disorder with panic attacks As above, remains stable Continue the Lexapro and the hydroxyzine as needed  3. Class 1 obesity due to excess calories without serious comorbidity with body mass index (BMI) of 30.0 to 30.9 in adult Continue with regular exercise and dietary modifications to help with weight management encouraged.  4.  GERD without esophagitis Continue the Pepcid twice daily as that has been helpful in the recent past. Do not feel changing to a PPI is  indicated presently. Also continue dietary modifications and reflux precautions.  Discussed potentially rechecking some labs at some point, and did review prior labs.  Feel reasonable not checking today, and do so on her next follow-up visit, and she agreed.  Will schedule follow-up visit in 6 months time, sooner as needed. Did inform her today that the follow-up will be with a different provider, as I will be leaving the practice prior to her planned follow-up.   Jamelle Haring, MD 07/07/20 10:07 AM

## 2020-07-31 ENCOUNTER — Other Ambulatory Visit: Payer: Self-pay | Admitting: Internal Medicine

## 2021-05-24 ENCOUNTER — Ambulatory Visit: Payer: Self-pay | Admitting: Family Medicine

## 2021-06-02 ENCOUNTER — Ambulatory Visit: Payer: Self-pay | Admitting: Nurse Practitioner

## 2021-06-03 ENCOUNTER — Ambulatory Visit: Payer: Self-pay | Admitting: Nurse Practitioner

## 2021-06-03 ENCOUNTER — Encounter: Payer: Self-pay | Admitting: Nurse Practitioner

## 2021-06-03 ENCOUNTER — Other Ambulatory Visit: Payer: Self-pay

## 2021-06-03 VITALS — BP 120/72 | HR 91 | Temp 98.5°F | Resp 16 | Ht 65.0 in | Wt 233.5 lb

## 2021-06-03 DIAGNOSIS — E6609 Other obesity due to excess calories: Secondary | ICD-10-CM

## 2021-06-03 DIAGNOSIS — K219 Gastro-esophageal reflux disease without esophagitis: Secondary | ICD-10-CM

## 2021-06-03 DIAGNOSIS — Z6830 Body mass index (BMI) 30.0-30.9, adult: Secondary | ICD-10-CM

## 2021-06-03 DIAGNOSIS — Z23 Encounter for immunization: Secondary | ICD-10-CM

## 2021-06-03 DIAGNOSIS — F3341 Major depressive disorder, recurrent, in partial remission: Secondary | ICD-10-CM

## 2021-06-03 DIAGNOSIS — F410 Panic disorder [episodic paroxysmal anxiety] without agoraphobia: Secondary | ICD-10-CM

## 2021-06-03 DIAGNOSIS — F411 Generalized anxiety disorder: Secondary | ICD-10-CM

## 2021-06-03 MED ORDER — HYDROXYZINE HCL 10 MG PO TABS: 10.0000 mg | ORAL_TABLET | Freq: Three times a day (TID) | ORAL | 3 refills | Status: DC | PRN

## 2021-06-03 MED ORDER — ESCITALOPRAM OXALATE 20 MG PO TABS: ORAL_TABLET | ORAL | 3 refills | Status: DC

## 2021-06-03 NOTE — Progress Notes (Signed)
BP 120/72   Pulse 91   Temp 98.5 F (36.9 C) (Oral)   Resp 16   Ht 5\' 5"  (1.651 m)   Wt 233 lb 8 oz (105.9 kg)   SpO2 98%   BMI 38.86 kg/m    Subjective:    Patient ID: , female    DOB: [birth year], 25 y.o.   MRN: [id number]  HPI: Yolanda Gardner is a 25 y.o. female  Chief Complaint  Patient presents with   Follow-up    Anxiety, Depression medication refills   Depression: She is on Lexapro 20 mg.  She has run out of her medication.  Her PQ9 is 14.  She says it is because she has not had her medication.  She says when she is on her medication it works well. She denies any suicidal thoughts.  We will refill.    Anxiety: She is on hydroxyzine 10 mg at night, due to it causing drowsiness.  She says the medication is still helping with her anxiety.  She says when she has the medication it helps but she is currently out.  GAD score today is 11. We will refill her medication today.   GERD: She says she is taking Pepcid 2 times a day.  Normally only flares when she eats a trigger food.    Obesity: Current weight 233 lbs was 184 lbs last year.  Will work on physical activity and diet.   GAD 7 : Generalized Anxiety Score 06/03/2021 01/01/2020 02/26/2018  Nervous, Anxious, on Edge 2 3 1   Control/stop worrying 2 1 0  Worry too much - different things 2 1 1   Trouble relaxing 1 0 0  Restless 0 1 1  Easily annoyed or irritable 3 1 0  Afraid - awful might happen 1 0 0  Total GAD 7 Score 11 7 3   Anxiety Difficulty Somewhat difficult Somewhat difficult Not difficult at all     Depression screen [facility] 2/9 06/03/2021 07/07/2020 04/22/2020 01/01/2020 04/30/2019  Decreased Interest 2 0 0 1 1  Down, Depressed, Hopeless 2 0 1 1 1   PHQ - 2 Score 4 0 1 2 2   Altered sleeping 3 0 0 1 1  Tired, decreased energy 3 1 1 1 1   Change in appetite 1 0 0 0 1  Feeling bad or failure about yourself  2 0 1 0 1  Trouble concentrating 1 0 - 1 1  Moving slowly or fidgety/restless 0 0 0 1 1  Suicidal  thoughts 0 0 0 0 0  PHQ-9 Score 14 1 3 6 8   Difficult doing work/chores Somewhat difficult Not difficult at all Somewhat difficult Somewhat difficult Somewhat difficult  Some recent data might be hidden    Relevant past medical, surgical, family and social history reviewed and updated as indicated. Interim medical history since our last visit reviewed. Allergies and medications reviewed and updated.  Review of Systems  Constitutional: Negative for fever, positive for weight change.  Respiratory: Negative for cough and shortness of breath.   Cardiovascular: Negative for chest pain or palpitations.  Gastrointestinal: Negative for abdominal pain, no bowel changes.  Musculoskeletal: Negative for gait problem or joint swelling.  Skin: Negative for rash.  Neurological: Negative for dizziness or headache.  No other specific complaints in a complete review of systems (except as listed in HPI above).      Objective:    BP 120/72   Pulse 91   Temp 98.5 F (36.9 C) (Oral)  Resp 16   Ht 5\' 5"  (1.651 m)   Wt 233 lb 8 oz (105.9 kg)   SpO2 98%   BMI 38.86 kg/m   Wt Readings from Last 3 Encounters:  06/03/21 233 lb 8 oz (105.9 kg)  07/07/20 184 lb 4.8 oz (83.6 kg)  04/22/20 185 lb 14.4 oz (84.3 kg)    Physical Exam  Constitutional: Patient appears well-developed and well-nourished. Obese  No distress.  HEENT: head atraumatic, normocephalic, pupils equal and reactive to light, neck supple Cardiovascular: Normal rate, regular rhythm and normal heart sounds.  No murmur heard. No BLE edema. Pulmonary/Chest: Effort normal and breath sounds normal. No respiratory distress. Abdominal: Soft.  There is no tenderness. Psychiatric: Patient has a normal mood and affect. behavior is normal. Judgment and thought content normal.   Results for orders placed or performed during the hospital encounter of 04/06/20  Lipase, blood  Result Value Ref Range   Lipase 27 11 - 51 U/L  Comprehensive metabolic  panel  Result Value Ref Range   Sodium 140 135 - 145 mmol/L   Potassium 3.0 (L) 3.5 - 5.1 mmol/L   Chloride 103 98 - 111 mmol/L   CO2 22 22 - 32 mmol/L   Glucose, Bld 115 (H) 70 - 99 mg/dL   BUN 12 6 - 20 mg/dL   Creatinine, Ser 06/06/20 0.44 - 1.00 mg/dL   Calcium 9.8 8.9 - 7.42 mg/dL   Total Protein 8.5 (H) 6.5 - 8.1 g/dL   Albumin 5.1 (H) 3.5 - 5.0 g/dL   AST 19 15 - 41 U/L   ALT 20 0 - 44 U/L   Alkaline Phosphatase 67 38 - 126 U/L   Total Bilirubin 1.1 0.3 - 1.2 mg/dL   GFR calc non Af Amer >60 >60 mL/min   GFR calc Af Amer >60 >60 mL/min   Anion gap 15 5 - 15  CBC  Result Value Ref Range   WBC 17.0 (H) 4.0 - 10.5 K/uL   RBC 4.85 3.87 - 5.11 MIL/uL   Hemoglobin 14.2 12.0 - 15.0 g/dL   HCT 59.5 63.8 - 75.6 %   MCV 85.8 80.0 - 100.0 fL   MCH 29.3 26.0 - 34.0 pg   MCHC 34.1 30.0 - 36.0 g/dL   RDW 43.3 29.5 - 18.8 %   Platelets 445 (H) 150 - 400 K/uL   nRBC 0.0 0.0 - 0.2 %  Urinalysis, Complete w Microscopic Urine, Clean Catch  Result Value Ref Range   Color, Urine YELLOW (A) YELLOW   APPearance TURBID (A) CLEAR   Specific Gravity, Urine 1.032 (H) 1.005 - 1.030   pH 5.0 5.0 - 8.0   Glucose, UA NEGATIVE NEGATIVE mg/dL   Hgb urine dipstick LARGE (A) NEGATIVE   Bilirubin Urine NEGATIVE NEGATIVE   Ketones, ur 80 (A) NEGATIVE mg/dL   Protein, ur 30 (A) NEGATIVE mg/dL   Nitrite NEGATIVE NEGATIVE   Leukocytes,Ua NEGATIVE NEGATIVE   RBC / HPF 21-50 0 - 5 RBC/hpf   WBC, UA 6-10 0 - 5 WBC/hpf   Bacteria, UA NONE SEEN NONE SEEN   Squamous Epithelial / LPF 0-5 0 - 5   Mucus PRESENT   Urine Drug Screen, Qualitative  Result Value Ref Range   Tricyclic, Ur Screen NONE DETECTED NONE DETECTED   Amphetamines, Ur Screen NONE DETECTED NONE DETECTED   MDMA (Ecstasy)Ur Screen NONE DETECTED NONE DETECTED   Cocaine Metabolite,Ur [place] NONE DETECTED NONE DETECTED   Opiate, Ur Screen NONE DETECTED NONE  DETECTED   Phencyclidine (PCP) Ur S NONE DETECTED NONE DETECTED   Cannabinoid 50 Ng, Ur [place]  POSITIVE (A) NONE DETECTED   Barbiturates, Ur Screen NONE DETECTED NONE DETECTED   Benzodiazepine, Ur Scrn NONE DETECTED NONE DETECTED   Methadone Scn, Ur NONE DETECTED NONE DETECTED  hCG, quantitative, pregnancy  Result Value Ref Range   hCG, Beta Chain, Quant, S <1 <5 mIU/mL  Pregnancy, urine POC  Result Value Ref Range   Preg Test, Ur NEGATIVE NEGATIVE      Assessment & Plan:   1. Recurrent major depressive disorder, in partial remission (HCC)  - escitalopram (LEXAPRO) 20 MG tablet; TAKE 1 TABLET(20 MG) BY MOUTH DAILY  Dispense: 90 tablet; Refill: 3 - hydrOXYzine (ATARAX/VISTARIL) 10 MG tablet; Take 1 tablet (10 mg total) by mouth 3 (three) times daily as needed.  Dispense: 90 tablet; Refill: 3  2. Generalized anxiety disorder with panic attacks  - escitalopram (LEXAPRO) 20 MG tablet; TAKE 1 TABLET(20 MG) BY MOUTH DAILY  Dispense: 90 tablet; Refill: 3 - hydrOXYzine (ATARAX/VISTARIL) 10 MG tablet; Take 1 tablet (10 mg total) by mouth 3 (three) times daily as needed.  Dispense: 90 tablet; Refill: 3  3. Gastroesophageal reflux disease without esophagitis - continue taking pepcid and avoid trigger foods  4. Need for influenza vaccination  - Flu Vaccine QUAD 6+ mos PF IM (Fluarix Quad PF)  5. Class 1 obesity due to excess calories without serious comorbidity with body mass index (BMI) of 30.0 to 30.9 in adult  - work on physical activity and diet  Follow up plan: Return in about 6 months (around 12/02/2021) for cpe.

## 2021-08-07 IMAGING — CT CT ABD-PELV W/ CM
2 of 4 series · 16 of 46 positions shown, 18 images · IV contrast (APPLIED)
Comparison: 10/23/2019

CLINICAL DATA: Nausea, vomiting and diffuse abdominal pain.

EXAM:
CT ABDOMEN AND PELVIS WITH CONTRAST
TECHNIQUE: Multidetector CT imaging of the abdomen and pelvis was performed
using the standard protocol following bolus administration of
intravenous contrast.
CONTRAST:  100mL OMNIPAQUE IOHEXOL 300 MG/ML  SOLN

[Series 2: routine abd/pel with · axial · 0.80mm/px · z∈[-1012,-582]mm · 13 of 94 slices shown, 15 images]
[im 4/94  soft-tissue]
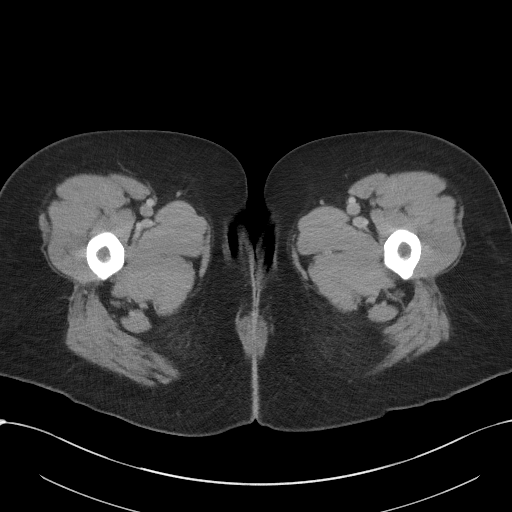
[im 4/94  bone]
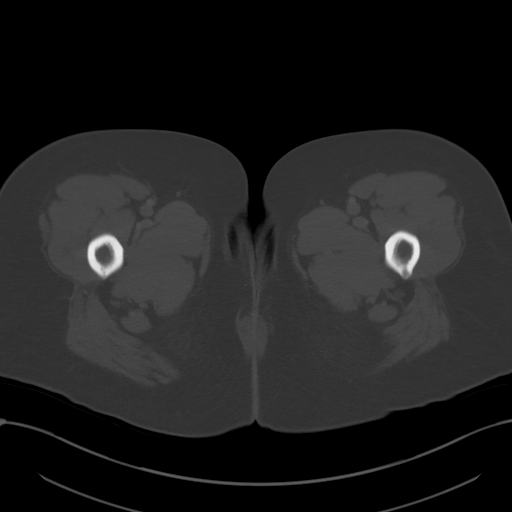
[im 12/94  soft-tissue]
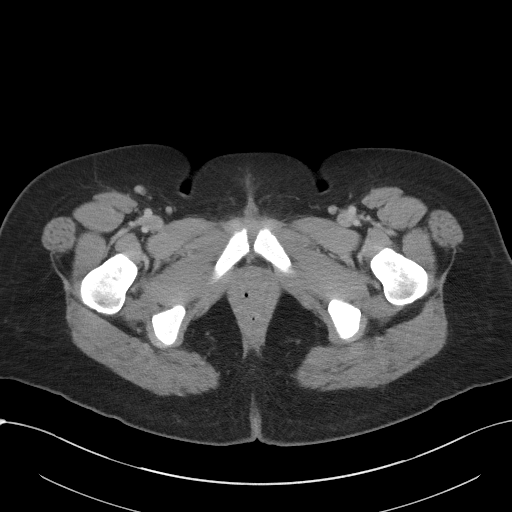
[im 20/94  soft-tissue]
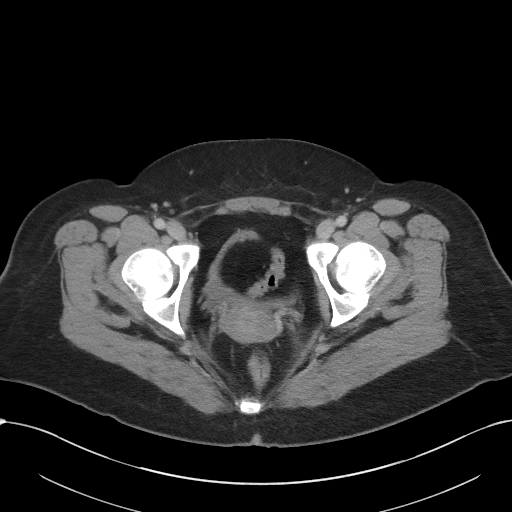
[im 28/94  soft-tissue]
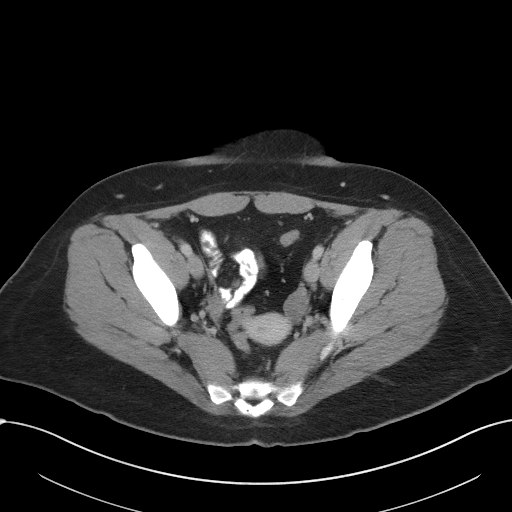
[im 32/94  soft-tissue]
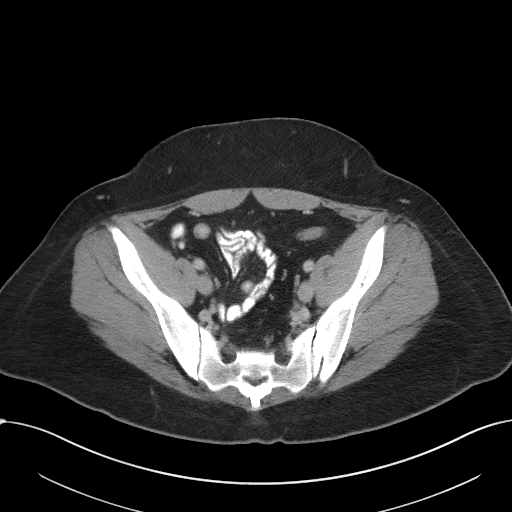
[im 39/94  soft-tissue]
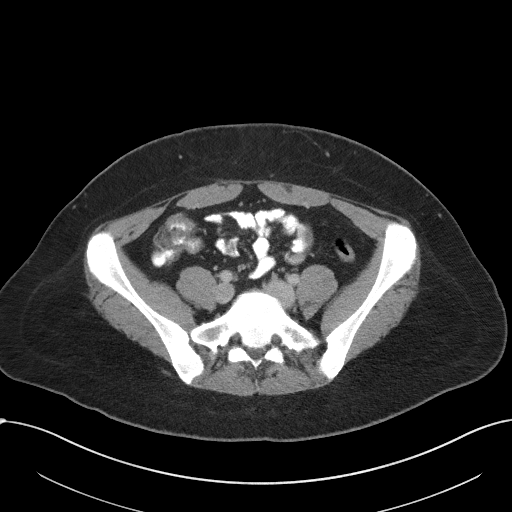
[im 47/94  soft-tissue]
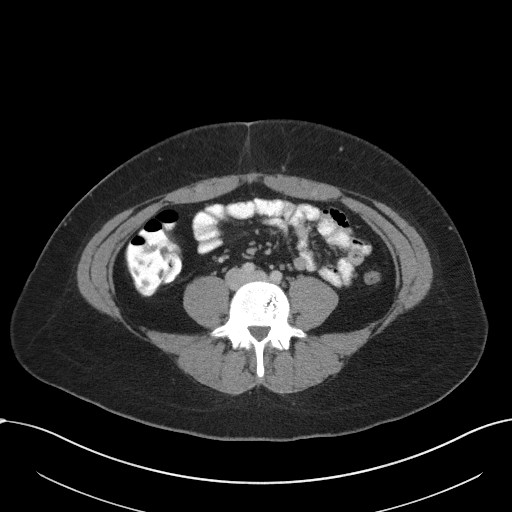
[im 55/94  soft-tissue]
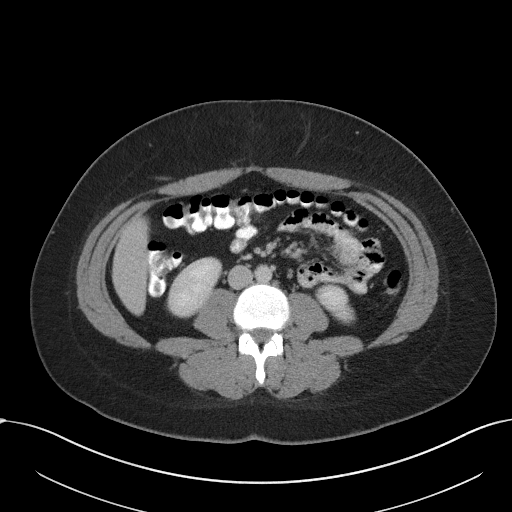
[im 63/94  soft-tissue]
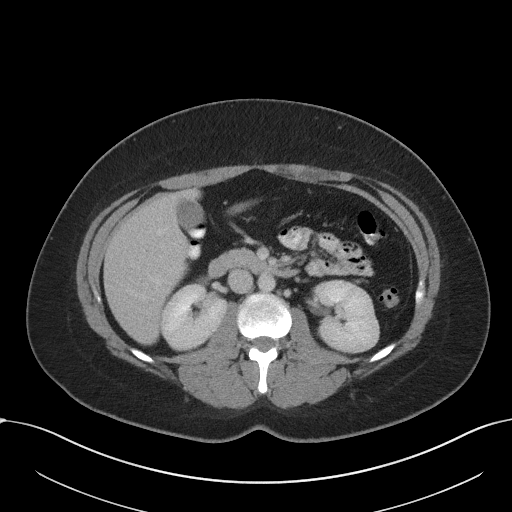
[im 63/94  bone]
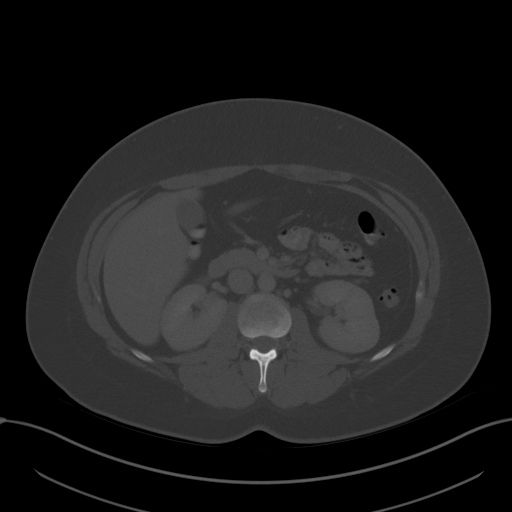
[im 66/94  soft-tissue]
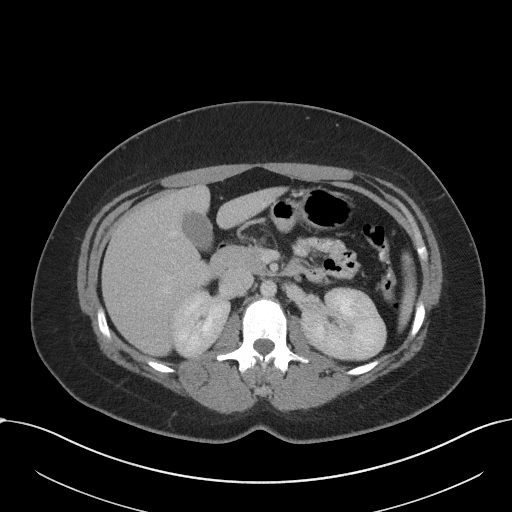
[im 74/94  soft-tissue]
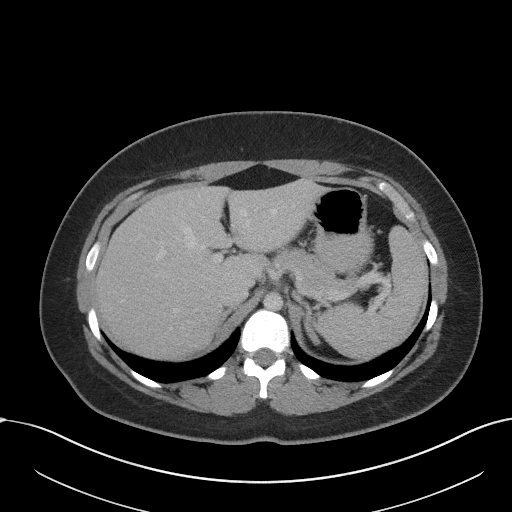
[im 82/94  soft-tissue]
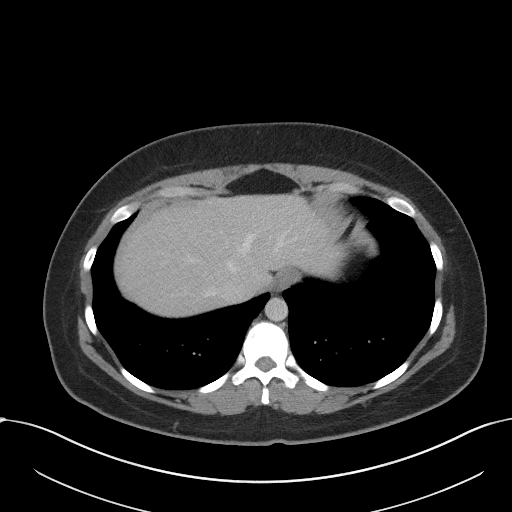
[im 90/94  soft-tissue]
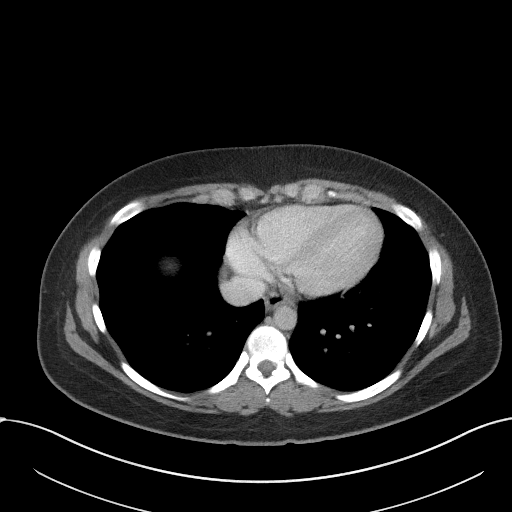

[Series 5: coronal st · coronal · 0.70mm/px · 3 of 93 slices shown]
[im 31/93  soft-tissue]
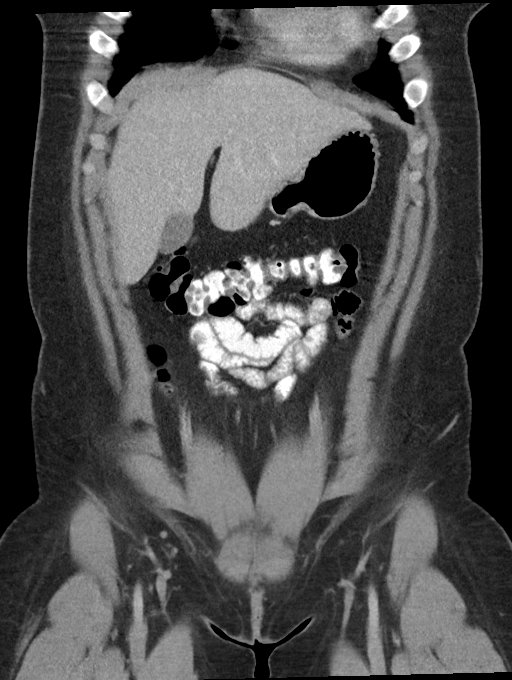
[im 41/93  soft-tissue]
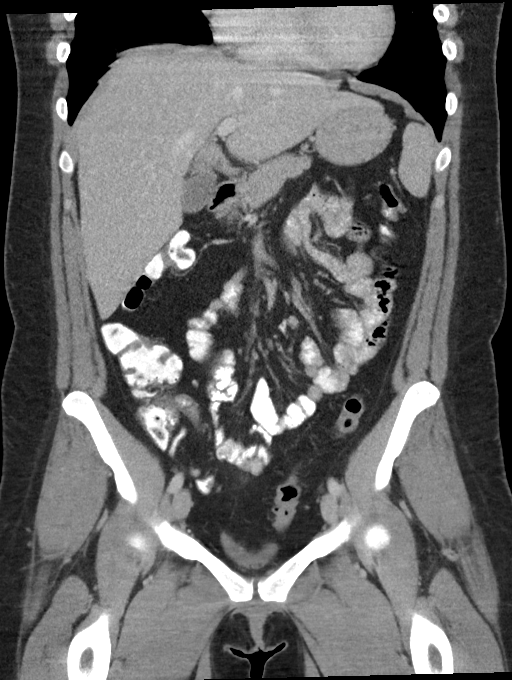
[im 52/93  soft-tissue]
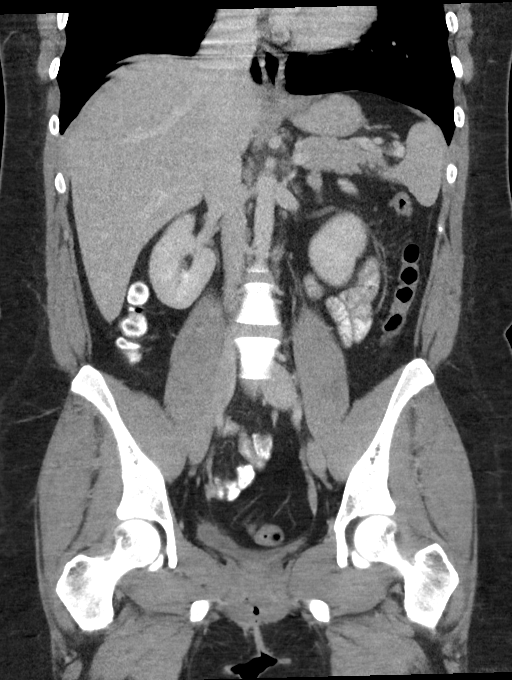

[16 of 46 positions shown; findings below may reference images not displayed]

FINDINGS: Lower chest: The lung bases are clear of acute process. No pleural
effusion or pulmonary lesions. The heart is normal in size. No
pericardial effusion. The distal esophagus and aorta are
unremarkable.

Hepatobiliary: No focal hepatic lesions or intrahepatic biliary
dilatation. The gallbladder is normal. No common bile duct
dilatation.

Pancreas: No mass, inflammation or ductal dilatation.

Spleen: Normal size. No focal lesions.

Adrenals/Urinary Tract: The adrenal glands and kidneys are
unremarkable. No renal, ureteral or bladder calculi or mass. No CT
findings to suggest pyelonephritis.

Stomach/Bowel: The stomach, duodenum, small bowel and colon are
unremarkable. No acute inflammatory changes, mass lesions or
obstructive findings. The terminal ileum is normal. The appendix is
normal.

Vascular/Lymphatic: The aorta is normal in caliber. No dissection.
The branch vessels are patent. The major venous structures are
patent. No mesenteric or retroperitoneal mass or adenopathy. Small
scattered lymph nodes are noted.

Reproductive: The uterus and ovaries are normal.

Other: No pelvic mass or adenopathy. No free pelvic fluid
collections. No inguinal mass or adenopathy. No abdominal wall
hernia or subcutaneous lesions.

Musculoskeletal: No significant bony findings.
IMPRESSION: Unremarkable abdominal/pelvic CT scan. No acute abdominal/pelvic
findings, mass lesions or adenopathy.

## 2021-12-03 ENCOUNTER — Encounter: Payer: Self-pay | Admitting: Nurse Practitioner

## 2021-12-03 NOTE — Progress Notes (Deleted)
Name: Yolanda Gardner   MRN: [id number]    DOB: 1995/12/05   Date:12/03/2021 ? ?     Progress Note ? ?Subjective ? ?Chief Complaint ? ?No chief complaint on file. ? ? ?HPI ? ?Patient presents for annual CPE. ? ?Diet: *** ?Exercise: ***  ?Sleep: *** ? 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 ?  ? ?Depression: Phq 9 is  {Desc; negative/positive:13464} ? ?  06/03/2021  ?  8:20 AM 07/07/2020  ? 10:02 AM 04/22/2020  ? 11:12 AM 01/01/2020  ?  1:32 PM 04/30/2019  ?  8:15 AM  ?Depression screen PHQ 2/9  ?Decreased Interest 2 0 0 1 1  ?Down, Depressed, Hopeless 2 0 '1 1 1  ' ?PHQ - 2 Score 4 0 '1 2 2  ' ?Altered sleeping 3 0 0 1 1  ?Tired, decreased energy '3 1 1 1 1  ' ?Change in appetite 1 0 0 0 1  ?Feeling bad or failure about yourself  2 0 1 0 1  ?Trouble concentrating 1 0  1 1  ?Moving slowly or fidgety/restless 0 0 0 1 1  ?Suicidal thoughts 0 0 0 0 0  ?PHQ-9 Score '14 1 3 6 8  ' ?Difficult doing work/chores Somewhat difficult Not difficult at all Somewhat difficult Somewhat difficult Somewhat difficult  ? ?Hypertension: ?BP Readings from Last 3 Encounters:  ?06/03/21 120/72  ?07/07/20 (!) 90/50  ?04/22/20 120/78  ? ?Obesity: ?Wt Readings from Last 3 Encounters:  ?06/03/21 233 lb 8 oz (105.9 kg)  ?07/07/20 184 lb 4.8 oz (83.6 kg)  ?04/22/20 185 lb 14.4 oz (84.3 kg)  ? ?BMI Readings from Last 3 Encounters:  ?06/03/21 38.86 kg/m?  ?07/07/20 30.67 kg/m?  ?04/22/20 30.94 kg/m?  ?  ? ?Vaccines:  ?HPV: up to at age 34 , ask insurance if age between 30-45  ?Shingrix: 7-64 yo and ask insurance if covered when patient above 54 yo ?Pneumonia:  educated and discussed with patient. ?Flu:  educated and discussed with patient. ? ?Hep C Screening: ordered ?STD testing and prevention (HIV/chl/gon/syphilis): ordered ?Intimate partner violence:*** ?Sexual History : ?Menstrual History/LMP/Abnormal Bleeding:  ?Incontinence Symptoms:  ? ?Breast cancer:  ?- Last Mammogram: no concerns, does not qualify ?-  BRCA gene screening: none ? ?Osteoporosis: Discussed high calcium and vitamin D supplementation, weight bearing exercises ? ?Cervical cancer screening: 03/29/2018, due ? ?Skin cancer: Discussed monitoring for atypical lesions  ?Colorectal cancer: no concerns, does not qualify ?Lung cancer:   Low Dose CT Chest recommended if Age 72-80 years, 20 pack-year currently smoking OR have quit w/in 15years. Patient does not qualify.   ?ECG: 01/01/2020 ? ?Advanced Care Planning: A voluntary discussion about advance care planning including the explanation and discussion of advance directives.  Discussed health care proxy and Living will, and the patient was able to identify a health care proxy as ***.  Patient {DOES_DOES WPY:[id number]} have a living will at present time. If patient does have living will, I have requested they bring this to the clinic to be scanned in to their chart. ? ?Lipids: ?Lab Results  ?Component Value Date  ? CHOL 207 (H) 03/29/2018  ? CHOL 187 (H) 04/24/2015  ? ?Lab Results  ?Component Value Date  ? HDL 39 (L) 03/29/2018  ? HDL 45 04/24/2015  ? ?Lab Results  ?Component Value Date  ? LDLCALC 140 (H) 03/29/2018  ? LDLCALC 120 (H) 04/24/2015  ? ?Lab Results  ?Component Value Date  ? TRIG  150 (H) 03/29/2018  ? TRIG 112 (H) 04/24/2015  ? ?Lab Results  ?Component Value Date  ? CHOLHDL 5.3 (H) 03/29/2018  ? ?No results found for: LDLDIRECT ? ?Glucose: ?Glucose, Bld  ?Date Value Ref Range Status  ?04/05/2020 115 (H) 70 - 99 mg/dL Final  ?  Comment:  ?  Glucose reference range applies only to samples taken after fasting for at least 8 hours.  ?02/07/2020 95 70 - 99 mg/dL Final  ?01/28/2020 116 (H) 70 - 99 mg/dL Final  ?  Comment:  ?  Glucose reference range applies only to samples taken after fasting for at least 8 hours.  ? ? ?Patient Active Problem List  ? Diagnosis Date Noted  ? Gastroesophageal reflux disease without esophagitis 04/22/2020  ? Generalized anxiety disorder with panic attacks 01/01/2020  ? Class 1  obesity due to excess calories without serious comorbidity with body mass index (BMI) of 30.0 to 30.9 in adult 03/29/2018  ? Vitamin D deficiency 05/25/2015  ? Depression, major, recurrent ([place]) 04/24/2015  ? ? ?Past Surgical History:  ?Procedure Laterality Date  ? WISDOM TOOTH EXTRACTION    ? ? ?Family History  ?Problem Relation Age of Onset  ? Depression Mother   ? Hypertension Maternal Grandmother   ? Diabetes Maternal Aunt   ? Stomach cancer Neg Hx   ? Colon cancer Neg Hx   ? Esophageal cancer Neg Hx   ? Pancreatic cancer Neg Hx   ? ? ?Social History  ? ?Socioeconomic History  ? Marital status: Single  ?  Spouse name: Not on file  ? Number of children: Not on file  ? Years of education: Not on file  ? Highest education level: Not on file  ?Occupational History  ? Not on file  ?Tobacco Use  ? Smoking status: Never  ? Smokeless tobacco: Never  ?Vaping Use  ? Vaping Use: Never used  ?Substance and Sexual Activity  ? Alcohol use: Yes  ?  Comment: Social  ? Drug use: Yes  ?  Types: Marijuana  ?  Comment: 1 x a week  ? Sexual activity: Not Currently  ?Other Topics Concern  ? Not on file  ?Social History Narrative  ? Not on file  ? ?Social Determinants of Health  ? ?Financial Resource Strain: Not on file  ?Food Insecurity: Not on file  ?Transportation Needs: Not on file  ?Physical Activity: Not on file  ?Stress: Not on file  ?Social Connections: Not on file  ?Intimate Partner Violence: Not on file  ? ? ? ?Current Outpatient Medications:  ?  escitalopram (LEXAPRO) 20 MG tablet, TAKE 1 TABLET(20 MG) BY MOUTH DAILY, Disp: 90 tablet, Rfl: 3 ?  famotidine (PEPCID) 20 MG tablet, TAKE 1 TABLET(20 MG) BY MOUTH TWICE DAILY, Disp: 60 tablet, Rfl: 3 ?  hydrOXYzine (ATARAX/VISTARIL) 10 MG tablet, Take 1 tablet (10 mg total) by mouth 3 (three) times daily as needed., Disp: 90 tablet, Rfl: 3 ?  Multiple Vitamins-Minerals (WOMENS MULTIVITAMIN PO), Take by mouth., Disp: , Rfl:  ? ?No Known  Allergies ? ? ?ROS ? ?*** ? ?Objective ? ?There were no vitals filed for this visit. ? ?There is no height or weight on file to calculate BMI. ? ?Physical Exam ?*** ? ? ?Fall Risk: ? ?  06/03/2021  ?  8:20 AM 07/07/2020  ? 10:01 AM 04/22/2020  ? 11:11 AM 01/01/2020  ?  1:32 PM 04/30/2019  ?  8:15 AM  ?Fall Risk   ?Falls in the  past year? 0 0 0 0 0  ?Number falls in past yr: 0 0 0 0 0  ?Injury with Fall? 0 0 0 0 0  ?Follow up Falls evaluation completed  Falls evaluation completed Falls evaluation completed Falls evaluation completed  ? ?*** ? ?Functional Status Survey: ?  ?*** ? ?Assessment & Plan ? ?There are no diagnoses linked to this encounter. ? ?-USPSTF grade A and B recommendations reviewed with patient; age-appropriate recommendations, preventive care, screening tests, etc discussed and encouraged; healthy living encouraged; see AVS for patient education given to patient ?-Discussed importance of 150 minutes of physical activity weekly, eat two servings of fish weekly, eat one serving of tree nuts ( cashews, pistachios, pecans, almonds.Marland Kitchen) every other day, eat 6 servings of fruit/vegetables daily and drink plenty of water and avoid sweet beverages.  ? ? ?

## 2021-12-08 ENCOUNTER — Emergency Department
Admission: EM | Admit: 2021-12-08 | Discharge: 2021-12-08 | Disposition: A | Discharge status: Home / Self Care | Payer: 59 | Attending: Student in an Organized Health Care Education/Training Program | Admitting: Student in an Organized Health Care Education/Training Program

## 2021-12-08 ENCOUNTER — Encounter: Payer: Self-pay | Admitting: Emergency Medicine

## 2021-12-08 ENCOUNTER — Other Ambulatory Visit: Payer: Self-pay

## 2021-12-08 ENCOUNTER — Emergency Department: Payer: 59

## 2021-12-08 DIAGNOSIS — D72829 Elevated white blood cell count, unspecified: Secondary | ICD-10-CM | POA: Diagnosis not present

## 2021-12-08 DIAGNOSIS — R1084 Generalized abdominal pain: Secondary | ICD-10-CM | POA: Diagnosis not present

## 2021-12-08 DIAGNOSIS — R112 Nausea with vomiting, unspecified: Secondary | ICD-10-CM | POA: Insufficient documentation

## 2021-12-08 LAB — CBC
HCT: 42.9 % (ref 36.0–46.0)
Hemoglobin: 14.4 g/dL (ref 12.0–15.0)
MCH: 28.6 pg (ref 26.0–34.0)
MCHC: 33.6 g/dL (ref 30.0–36.0)
MCV: 85.1 fL (ref 80.0–100.0)
Platelets: 447 10*3/uL — ABNORMAL HIGH (ref 150–400)
RBC: 5.04 MIL/uL (ref 3.87–5.11)
RDW: 12.3 % (ref 11.5–15.5)
WBC: 14.1 10*3/uL — ABNORMAL HIGH (ref 4.0–10.5)
nRBC: 0 % (ref 0.0–0.2)

## 2021-12-08 LAB — URINALYSIS, ROUTINE W REFLEX MICROSCOPIC
Bilirubin Urine: NEGATIVE
Glucose, UA: 50 mg/dL — AB
Hgb urine dipstick: NEGATIVE
Ketones, ur: 80 mg/dL — AB
Nitrite: NEGATIVE
Protein, ur: 100 mg/dL — AB
Specific Gravity, Urine: 1.033 — ABNORMAL HIGH (ref 1.005–1.030)
pH: 5 (ref 5.0–8.0)

## 2021-12-08 LAB — COMPREHENSIVE METABOLIC PANEL
ALT: 21 U/L (ref 0–44)
AST: 18 U/L (ref 15–41)
Albumin: 4.8 g/dL (ref 3.5–5.0)
Alkaline Phosphatase: 55 U/L (ref 38–126)
Anion gap: 13 (ref 5–15)
BUN: 14 mg/dL (ref 6–20)
CO2: 21 mmol/L — ABNORMAL LOW (ref 22–32)
Calcium: 10 mg/dL (ref 8.9–10.3)
Chloride: 105 mmol/L (ref 98–111)
Creatinine, Ser: 0.74 mg/dL (ref 0.44–1.00)
GFR, Estimated: >60 mL/min (ref >60)
Glucose, Bld: 166 mg/dL — ABNORMAL HIGH (ref 70–99)
Potassium: 3.2 mmol/L — ABNORMAL LOW (ref 3.5–5.1)
Sodium: 139 mmol/L (ref 135–145)
Total Bilirubin: 1 mg/dL (ref 0.3–1.2)
Total Protein: 8.6 g/dL — ABNORMAL HIGH (ref 6.5–8.1)

## 2021-12-08 LAB — POC URINE PREG, ED: Preg Test, Ur: NEGATIVE

## 2021-12-08 LAB — LIPASE, BLOOD: Lipase: 34 U/L (ref 11–51)

## 2021-12-08 MED ORDER — ONDANSETRON HCL 4 MG/2ML IJ SOLN
4.0000 mg | Freq: Once | INTRAMUSCULAR | Status: AC
  Administered 2021-12-08: 4 mg via INTRAVENOUS
  Filled 2021-12-08: qty 2

## 2021-12-08 MED ORDER — IOHEXOL 300 MG/ML  SOLN
100.0000 mL | Freq: Once | INTRAMUSCULAR | Status: AC | PRN
  Administered 2021-12-08: 100 mL via INTRAVENOUS
  Filled 2021-12-08: qty 100

## 2021-12-08 MED ORDER — ONDANSETRON 4 MG PO TBDP: 4.0000 mg | ORAL_TABLET | Freq: Once | ORAL | Status: DC

## 2021-12-08 MED ORDER — ONDANSETRON 4 MG PO TBDP: 4.0000 mg | ORAL_TABLET | Freq: Three times a day (TID) | ORAL | 0 refills | Status: DC | PRN

## 2021-12-08 MED ORDER — SODIUM CHLORIDE 0.9 % IV BOLUS
1000.0000 mL | Freq: Once | INTRAVENOUS | Status: AC
  Administered 2021-12-08: 1000 mL via INTRAVENOUS

## 2021-12-08 MED ORDER — SODIUM CHLORIDE 0.9 % IV BOLUS
250.0000 mL | Freq: Once | INTRAVENOUS | Status: AC
  Administered 2021-12-08: 250 mL via INTRAVENOUS

## 2021-12-08 NOTE — Discharge Instructions (Addendum)
Follow-up with your primary care provider if any continued problems.  Return to the emergency department if any severe worsening of your symptoms.  Drink clear fluids like we discussed.  A prescription for Zofran was sent to the pharmacy which is 1 every 8 hours if needed for nausea or vomiting. ?

## 2021-12-08 NOTE — ED Triage Notes (Signed)
Pt via POV from home. Pt c/o epigastric abd pain and vomiting since 0900 yesterday. Denies any abd surgeries. Denies fever. States she has a hx of GERD and pain is a burning type of pain. Pt is A&OX4 and NAD.  ?

## 2021-12-08 NOTE — ED Provider Notes (Signed)
? ?[facility] ?Provider Note ? ? ? Event Date/Time  ? First MD Initiated Contact with Patient 12/08/21 0900   ?  (approximate) ? ? ?History  ? ?Vomiting ? ? ?HPI ? ?Yolanda Gardner is a 26 y.o. female   presents to the ED with complaint of epigastric abdominal pain beginning at 9 AM yesterday.  Patient has since then developed persistent vomiting but denies any fever, chills or diarrhea.  No one else in the family is sick at this time.  Patient today complains of generalized abdominal pain.  She denies any urinary symptoms, flank pain or history of kidney stones.  Patient has had vomiting today even with fluids.  Patient has history of anxiety, GERD, vitamin D deficiency and depression.  She rates her pain as an 8 out of 10. ? ?  ? ? ?Physical Exam  ? ?Triage Vital Signs: ?ED Triage Vitals  ?Enc Vitals Group  ?   BP 12/08/21 0845 (!) 141/98  ?   Pulse Rate 12/08/21 0845 97  ?   Resp 12/08/21 0845 18  ?   Temp 12/08/21 0845 98.9 ?F (37.2 ?C)  ?   Temp Source 12/08/21 0845 Oral  ?   SpO2 12/08/21 0845 99 %  ?   Weight 12/08/21 0844 190 lb (86.2 kg)  ?   Height 12/08/21 0844 _0  (1.651 m)  ?   Head Circumference --   ?   Peak Flow --   ?   Pain Score 12/08/21 0844 8  ?   Pain Loc --   ?   Pain Edu? --   ?   Excl. in [place]? --   ? ? ?Most recent vital signs: ?Vitals:  ? 12/08/21 0845  ?BP: (!) 141/98  ?Pulse: 97  ?Resp: 18  ?Temp: 98.9 ?F (37.2 ?C)  ?SpO2: 99%  ? ? ? ?General: Awake, no distress.  ?CV:  Good peripheral perfusion.  Heart regular rate and rhythm. ?Resp:  Normal effort.  Lungs are clear bilaterally. ?Abd:  No distention.  Soft with moderate epigastric and right upper quadrant tenderness.  Patient also has nonlocalized abdominal pain periumbilical and both left and right lower quadrant areas.  Bowel sounds are present x4 quadrants.  No rebound or referred pain on exam. ?Other:   ? ? ?ED Results / Procedures / Treatments  ? ?Labs ?(all labs ordered are listed, but only abnormal  results are displayed) ?Labs Reviewed  ?COMPREHENSIVE METABOLIC PANEL - Abnormal; Notable for the following components:  ?    Result Value  ? Potassium 3.2 (*)   ? CO2 21 (*)   ? Glucose, Bld 166 (*)   ? Total Protein 8.6 (*)   ? All other components within normal limits  ?CBC - Abnormal; Notable for the following components:  ? WBC 14.1 (*)   ? Platelets 447 (*)   ? All other components within normal limits  ?URINALYSIS, ROUTINE W REFLEX MICROSCOPIC - Abnormal; Notable for the following components:  ? Color, Urine YELLOW (*)   ? APPearance HAZY (*)   ? Specific Gravity, Urine 1.033 (*)   ? Glucose, UA 50 (*)   ? Ketones, ur 80 (*)   ? Protein, ur 100 (*)   ? Leukocytes,Ua TRACE (*)   ? Bacteria, UA FEW (*)   ? All other components within normal limits  ?LIPASE, BLOOD  ?POC URINE PREG, ED  ? ? ? ?EKG ? ?Sinus rhythm with rate of 71 with sinus  arrhythmia ?Vent. rate 71 BPM ?PR interval 152 ms ?QRS duration 78 ms ?QT/QTcB 414/449 ms ?P-R-T axes 32 29 30 ? ? ?RADIOLOGY ?Ultrasound of the abdomen right upper quadrant did not show any gallbladder issues. ?CT abdomen and pelvis was negative for cholecystitis, cholelithiasis, appendicitis or abscess. ? ? ? ?PROCEDURES: ? ?Critical Care performed:  ? ?Procedures ? ? ?MEDICATIONS ORDERED IN ED: ?Medications  ?ondansetron (ZOFRAN) injection 4 mg (4 mg Intravenous Given 12/08/21 0939)  ?sodium chloride 0.9 % bolus 1,000 mL (0 mLs Intravenous Stopped 12/08/21 1128)  ?iohexol (OMNIPAQUE) 300 MG/ML solution 100 mL (100 mLs Intravenous Contrast Given 12/08/21 1046)  ?ondansetron [facility]) injection 4 mg (4 mg Intravenous Given 12/08/21 1128)  ?sodium chloride 0.9 % bolus 250 mL (0 mLs Intravenous Stopped 12/08/21 1218)  ? ? ? ?IMPRESSION / MDM / ASSESSMENT AND PLAN / ED COURSE  ?I reviewed the triage vital signs and the nursing notes. ? ? ?Differential diagnosis includes, but is not limited to, generalized abdominal pain, viral illness, cholelithiasis, cholecystitis, acute  appendicitis. ? ?26 year old female presents to the ED with generalized abdominal pain that began at 9 AM yesterday.  Patient denied any symptoms involving a viral illness.  Patient reports that she has been unable to retain any fluids and has been vomiting since yesterday.  Patient was given IV fluids along with IV Zofran which helped relieve her symptoms.  Urinalysis did show ketones at 80 with small amount of glucose and nitrates were negative.  Lipase was 34, pregnancy test was negative, met C was unremarkable with the exception of potassium being minimally low at 3.2 and WBC was slightly elevated at 14.1.  Patient was reassured with ultrasound and CT scan report being negative.  Vomiting was resolved while in the emergency department.  She is to stay with clear liquids for the remainder of the day and also Zofran was sent to her pharmacy as needed for nausea or vomiting.  Patient was given a note to remain out of work.  She is to follow-up with her PCP if any continued problems but most likely this is a viral illness and should resolve within a couple days. ? ? ? ?  ? ? ?FINAL CLINICAL IMPRESSION(S) / ED DIAGNOSES  ? ?Final diagnoses:  ?Nausea and vomiting, unspecified vomiting type  ?Generalized abdominal pain  ? ? ? ?Rx / DC Orders  ? ?ED Discharge Orders   ? ?      Ordered  ?  ondansetron (ZOFRAN-ODT) 4 MG disintegrating tablet  Every 8 hours PRN       ? 12/08/21 1211  ? ?  ?  ? ?  ? ? ? ?Note:  This document was prepared using Dragon voice recognition software and may include unintentional dictation errors. ?  ?Johnn Hai, PA-C ?12/08/21 1256 ? ?  ?Merlyn Lot, MD ?12/08/21 1355 ? ?

## 2021-12-08 NOTE — ED Triage Notes (Signed)
Pt in with co vomiting since 0900 yesterday, unable to keep food or fluids down.  ?

## 2021-12-09 ENCOUNTER — Other Ambulatory Visit: Payer: Self-pay

## 2021-12-09 ENCOUNTER — Emergency Department
Admission: EM | Admit: 2021-12-09 | Discharge: 2021-12-09 | Disposition: A | Discharge status: Home / Self Care | Payer: 59 | Attending: Emergency Medicine | Admitting: Emergency Medicine

## 2021-12-09 DIAGNOSIS — F1290 Cannabis use, unspecified, uncomplicated: Secondary | ICD-10-CM

## 2021-12-09 DIAGNOSIS — E876 Hypokalemia: Secondary | ICD-10-CM | POA: Diagnosis not present

## 2021-12-09 DIAGNOSIS — F12188 Cannabis abuse with other cannabis-induced disorder: Secondary | ICD-10-CM | POA: Insufficient documentation

## 2021-12-09 DIAGNOSIS — R112 Nausea with vomiting, unspecified: Secondary | ICD-10-CM | POA: Diagnosis present

## 2021-12-09 DIAGNOSIS — R1110 Vomiting, unspecified: Secondary | ICD-10-CM | POA: Insufficient documentation

## 2021-12-09 LAB — CBC
HCT: 38.6 % (ref 36.0–46.0)
Hemoglobin: 12.9 g/dL (ref 12.0–15.0)
MCH: 28.7 pg (ref 26.0–34.0)
MCHC: 33.4 g/dL (ref 30.0–36.0)
MCV: 86 fL (ref 80.0–100.0)
Platelets: 397 10*3/uL (ref 150–400)
RBC: 4.49 MIL/uL (ref 3.87–5.11)
RDW: 12.4 % (ref 11.5–15.5)
WBC: 11.1 10*3/uL — ABNORMAL HIGH (ref 4.0–10.5)
nRBC: 0 % (ref 0.0–0.2)

## 2021-12-09 LAB — URINALYSIS, ROUTINE W REFLEX MICROSCOPIC
Bilirubin Urine: NEGATIVE
Glucose, UA: NEGATIVE mg/dL
Hgb urine dipstick: NEGATIVE
Ketones, ur: 5 mg/dL — AB
Leukocytes,Ua: NEGATIVE
Nitrite: NEGATIVE
Protein, ur: NEGATIVE mg/dL
Specific Gravity, Urine: 1.013 (ref 1.005–1.030)
pH: 6 (ref 5.0–8.0)

## 2021-12-09 LAB — COMPREHENSIVE METABOLIC PANEL
ALT: 20 U/L (ref 0–44)
AST: 22 U/L (ref 15–41)
Albumin: 4.4 g/dL (ref 3.5–5.0)
Alkaline Phosphatase: 47 U/L (ref 38–126)
Anion gap: 10 (ref 5–15)
BUN: 11 mg/dL (ref 6–20)
CO2: 24 mmol/L (ref 22–32)
Calcium: 9.3 mg/dL (ref 8.9–10.3)
Chloride: 103 mmol/L (ref 98–111)
Creatinine, Ser: 0.73 mg/dL (ref 0.44–1.00)
GFR, Estimated: >60 mL/min (ref >60)
Glucose, Bld: 114 mg/dL — ABNORMAL HIGH (ref 70–99)
Potassium: 3.1 mmol/L — ABNORMAL LOW (ref 3.5–5.1)
Sodium: 137 mmol/L (ref 135–145)
Total Bilirubin: 1 mg/dL (ref 0.3–1.2)
Total Protein: 8.1 g/dL (ref 6.5–8.1)

## 2021-12-09 LAB — LIPASE, BLOOD: Lipase: 27 U/L (ref 11–51)

## 2021-12-09 LAB — PREGNANCY, URINE: Preg Test, Ur: NEGATIVE

## 2021-12-09 MED ORDER — DIPHENHYDRAMINE HCL 50 MG/ML IJ SOLN
25.0000 mg | Freq: Once | INTRAMUSCULAR | Status: AC
Start: 2021-12-09 — End: 2021-12-09
  Administered 2021-12-09: 25 mg via INTRAVENOUS
  Filled 2021-12-09: qty 1

## 2021-12-09 MED ORDER — LACTATED RINGERS IV BOLUS
1000.0000 mL | Freq: Once | INTRAVENOUS | Status: AC
Start: 2021-12-09 — End: 2021-12-09
  Administered 2021-12-09: 1000 mL via INTRAVENOUS

## 2021-12-09 MED ORDER — POTASSIUM CHLORIDE CRYS ER 20 MEQ PO TBCR
40.0000 meq | EXTENDED_RELEASE_TABLET | Freq: Once | ORAL | Status: AC
  Administered 2021-12-09: 40 meq via ORAL
  Filled 2021-12-09: qty 2

## 2021-12-09 MED ORDER — DROPERIDOL 2.5 MG/ML IJ SOLN
2.5000 mg | Freq: Once | INTRAMUSCULAR | Status: AC
  Administered 2021-12-09: 2.5 mg via INTRAVENOUS
  Filled 2021-12-09: qty 2

## 2021-12-09 MED ORDER — HYDROXYZINE PAMOATE 25 MG PO CAPS: 25.0000 mg | ORAL_CAPSULE | Freq: Three times a day (TID) | ORAL | 0 refills | Status: DC | PRN

## 2021-12-09 NOTE — ED Triage Notes (Signed)
Pt c/o abd pain with N/V since Tuesday , states she is not able to keep anything down, pt was seen here yesterday with the same c/o, pt also is concerned she is having withdrawals from "CBD cards". ?

## 2021-12-09 NOTE — ED Notes (Signed)
Pt. Reports relief from nausea. ?

## 2021-12-09 NOTE — ED Provider Notes (Signed)
? ?[facility] ?Provider Note ? ? ? Event Date/Time  ? First MD Initiated Contact with Patient 12/09/21 1515   ?  (approximate) ? ? ?History  ? ?Chief Complaint ?Emesis ? ? ?HPI ? ?Mahoganie A Bergemann is a 26 y.o. female with past medical history of GERD, major depressive disorder, and generalized anxiety disorder who presents to the ED complaining of nausea and vomiting.  Patient reports that she has been dealing with persistent nausea and vomiting for about the past 3 days, severe to the point that she has been unable to keep down either liquids or solids.  Is been associated with burning pain in her epigastric area and subjective fevers, but she denies any dysuria, hematuria, or flank pain.  She was having diarrhea earlier in the course of illness, but this has since resolved.  She denies any blood in her emesis or stool.  She was seen in the ED for the symptoms yesterday, when CT scan and ultrasound were unremarkable.  She does state that she uses a legal combined CBD and THC product for help with her anxiety, has had similar symptoms related to cannabinoid hyperemesis in the past.  She last used this cannabinoid product yesterday. ?  ? ? ?Physical Exam  ? ?Triage Vital Signs: ?ED Triage Vitals [12/09/21 1405]  ?Enc Vitals Group  ?   BP 120/65  ?   Pulse Rate (!) 53  ?   Resp 17  ?   Temp 99.3 ?F (37.4 ?C)  ?   Temp Source Oral  ?   SpO2 96 %  ?   Weight   ?   Height   ?   Head Circumference   ?   Peak Flow   ?   Pain Score   ?   Pain Loc   ?   Pain Edu?   ?   Excl. in [place]?   ? ? ?Most recent vital signs: ?Vitals:  ? 12/09/21 1547 12/09/21 1716  ?BP:  139/66  ?Pulse:  64  ?Resp:  16  ?Temp: 99.9 ?F (37.7 ?C) 98.1 ?F (36.7 ?C)  ?SpO2:  95%  ? ? ?Constitutional: Alert and oriented. ?Eyes: Conjunctivae are normal. ?Head: Atraumatic. ?Nose: No congestion/rhinnorhea. ?Mouth/Throat: Mucous membranes are moist.  ?Cardiovascular: Normal rate, regular rhythm. Grossly normal heart sounds.  2+ radial  pulses bilaterally. ?Respiratory: Normal respiratory effort.  No retractions. Lungs CTAB. ?Gastrointestinal: Soft and nontender.  No CVA tenderness bilaterally.  No distention. ?Musculoskeletal: No lower extremity tenderness nor edema.  ?Neurologic:  Normal speech and language. No gross focal neurologic deficits are appreciated. ? ? ? ?ED Results / Procedures / Treatments  ? ?Labs ?(all labs ordered are listed, but only abnormal results are displayed) ?Labs Reviewed  ?COMPREHENSIVE METABOLIC PANEL - Abnormal; Notable for the following components:  ?    Result Value  ? Potassium 3.1 (*)   ? Glucose, Bld 114 (*)   ? All other components within normal limits  ?CBC - Abnormal; Notable for the following components:  ? WBC 11.1 (*)   ? All other components within normal limits  ?URINALYSIS, ROUTINE W REFLEX MICROSCOPIC - Abnormal; Notable for the following components:  ? Color, Urine YELLOW (*)   ? APPearance CLEAR (*)   ? Ketones, ur 5 (*)   ? All other components within normal limits  ?LIPASE, BLOOD  ?PREGNANCY, URINE  ?POC URINE PREG, ED  ? ? ? ?EKG ? ?ED ECG REPORT ?Harriet Masson, the attending  physician, personally viewed and interpreted this ECG. ? ? Date: 12/09/2021 ? EKG Time: 15:59 ? Rate: 61 ? Rhythm: normal sinus rhythm ? Axis: Normal ? Intervals:none ? ST&T Change: None ? ?PROCEDURES: ? ?Critical Care performed: No ? ?Procedures ? ? ?MEDICATIONS ORDERED IN ED: ?Medications  ?droperidol (INAPSINE) 2.5 MG/ML injection 2.5 mg (2.5 mg Intravenous Given 12/09/21 1543)  ?lactated ringers bolus 1,000 mL (0 mLs Intravenous Stopped 12/09/21 1727)  ?potassium chloride SA (KLOR-CON M) CR tablet 40 mEq (40 mEq Oral Given 12/09/21 1808)  ?diphenhydrAMINE (BENADRYL) injection 25 mg (25 mg Intravenous Given 12/09/21 1807)  ? ? ? ?IMPRESSION / MDM / ASSESSMENT AND PLAN / ED COURSE  ?I reviewed the triage vital signs and the nursing notes. ?             ?               ? ?26 y.o. female with past medical history of major  depressive disorder, GERD, and generalized anxiety disorder who presents to the ED complaining of persistent nausea and vomiting over the past 3 days associated with burning pain in her epigastric area. ? ?Differential diagnosis includes, but is not limited to, chemical and hyperemesis syndrome, gastroenteritis, cholecystitis, biliary colic, pancreatitis, hepatitis, gastritis, and GERD. ? ?Patient nontoxic-appearing and in no acute distress, vital signs are unremarkable and her abdominal exam is benign.  She does report similar symptoms in the past associated with cannabinoid hyperemesis, and I suspect this is the etiology of her symptoms today.  She had a CT scan of her abdomen/pelvis as well as right upper quadrant ultrasound performed yesterday that were unremarkable.  Given benign exam, do not feel repeat imaging is indicated at this time.  Labs are reassuring, BMP remarkable only for mild hypokalemia, LFTs and lipase are within normal limits.  CBC without anemia or leukocytosis.  Pregnancy testing and UA are pending, plan to treat symptomatically with IV droperidol if pregnancy is negative. ? ?Pregnancy testing is negative and UA does not appear consistent with infection.  Patient with significant improvement in nausea and abdominal pain following dose of droperidol, however she does complain of a jittery feeling that could represent extraparametal symptoms.  She was given IV Benadryl with improvement in this, is now tolerating water and crackers without difficulty.  She is appropriate for discharge home with PCP follow-up, was counseled to return to the ED for new worsening symptoms.  She is requesting small amount of medication for anxiety, for which we will prescribe Atarax and provided with referral for mental health providers.  Patient and mother agree with plan. ? ?  ? ? ?FINAL CLINICAL IMPRESSION(S) / ED DIAGNOSES  ? ?Final diagnoses:  ?Cannabinoid hyperemesis syndrome  ? ? ? ?Rx / DC Orders  ? ?ED  Discharge Orders   ? ? None  ? ?  ? ? ? ?Note:  This document was prepared using Dragon voice recognition software and may include unintentional dictation errors. ?  ?Chesley Noon, MD ?12/09/21 1845 ? ?

## 2022-08-18 ENCOUNTER — Encounter: Payer: Self-pay | Admitting: Nurse Practitioner

## 2022-08-18 ENCOUNTER — Other Ambulatory Visit: Payer: Self-pay

## 2022-08-18 ENCOUNTER — Ambulatory Visit (INDEPENDENT_AMBULATORY_CARE_PROVIDER_SITE_OTHER): Payer: Commercial Managed Care - HMO | Admitting: Nurse Practitioner

## 2022-08-18 VITALS — BP 120/72 | HR 87 | Temp 99.0°F | Resp 18 | Ht 65.0 in | Wt 224.2 lb

## 2022-08-18 DIAGNOSIS — F410 Panic disorder [episodic paroxysmal anxiety] without agoraphobia: Secondary | ICD-10-CM

## 2022-08-18 DIAGNOSIS — K219 Gastro-esophageal reflux disease without esophagitis: Secondary | ICD-10-CM | POA: Diagnosis not present

## 2022-08-18 DIAGNOSIS — Z131 Encounter for screening for diabetes mellitus: Secondary | ICD-10-CM

## 2022-08-18 DIAGNOSIS — E6609 Other obesity due to excess calories: Secondary | ICD-10-CM | POA: Diagnosis not present

## 2022-08-18 DIAGNOSIS — Z13220 Encounter for screening for lipoid disorders: Secondary | ICD-10-CM

## 2022-08-18 DIAGNOSIS — Z1159 Encounter for screening for other viral diseases: Secondary | ICD-10-CM

## 2022-08-18 DIAGNOSIS — F3341 Major depressive disorder, recurrent, in partial remission: Secondary | ICD-10-CM | POA: Diagnosis not present

## 2022-08-18 DIAGNOSIS — F411 Generalized anxiety disorder: Secondary | ICD-10-CM

## 2022-08-18 DIAGNOSIS — Z23 Encounter for immunization: Secondary | ICD-10-CM

## 2022-08-18 DIAGNOSIS — Z130 Encounter for screening for diseases of the blood and blood-forming organs and certain disorders involving the immune mechanism: Secondary | ICD-10-CM

## 2022-08-18 DIAGNOSIS — Z114 Encounter for screening for human immunodeficiency virus [HIV]: Secondary | ICD-10-CM

## 2022-08-18 DIAGNOSIS — Z6830 Body mass index (BMI) 30.0-30.9, adult: Secondary | ICD-10-CM

## 2022-08-18 MED ORDER — FAMOTIDINE 20 MG PO TABS: ORAL_TABLET | ORAL | 3 refills | Status: AC

## 2022-08-18 MED ORDER — ESCITALOPRAM OXALATE 20 MG PO TABS: ORAL_TABLET | ORAL | 3 refills | Status: DC

## 2022-08-18 MED ORDER — HYDROXYZINE HCL 10 MG PO TABS: 10.0000 mg | ORAL_TABLET | Freq: Three times a day (TID) | ORAL | 3 refills | Status: DC | PRN

## 2022-08-18 NOTE — Assessment & Plan Note (Signed)
Continue current treatment. 

## 2022-08-18 NOTE — Assessment & Plan Note (Signed)
Continue taking lexapro 20 mg daily, hydroxyzine 10 mg at night.

## 2022-08-18 NOTE — Progress Notes (Signed)
BP 120/72   Pulse 87   Temp 99 F (37.2 C) (Oral)   Resp 18   Ht 5\' 5"  (1.651 m)   Wt 224 lb 3.2 oz (101.7 kg)   LMP 08/01/2022   SpO2 98%   BMI 37.31 kg/m    Subjective:    Patient ID: [id number], female    DOB: [birth year], 26 y.o.   MRN: [id number]  HPI: Yolanda Gardner is a 26 y.o. female  Chief Complaint  Patient presents with   Depression    Follow up medication refill   Depression/anxiety: she currently takes lexapro 20 mg daily, hydroxyzine 10 mg at night.   patient reports she is doing well.  Her PHQ9 and GAD scores have improved. Will continue with current treatment.      08/18/2022    1:42 PM 06/03/2021    8:20 AM 07/07/2020   10:02 AM 04/22/2020   11:12 AM 01/01/2020    1:32 PM  Depression screen PHQ 2/9  Decreased Interest 0 2 0 0 1  Down, Depressed, Hopeless 1 2 0 1 1  PHQ - 2 Score 1 4 0 1 2  Altered sleeping 3 3 0 0 1  Tired, decreased energy 3 3 1 1 1   Change in appetite 1 1 0 0 0  Feeling bad or failure about yourself  1 2 0 1 0  Trouble concentrating 0 1 0  1  Moving slowly or fidgety/restless 0 0 0 0 1  Suicidal thoughts 0 0 0 0 0  PHQ-9 Score 9 14 1 3 6   Difficult doing work/chores Somewhat difficult Somewhat difficult Not difficult at all Somewhat difficult Somewhat difficult       08/18/2022    1:44 PM 06/03/2021    8:21 AM 01/01/2020    1:33 PM 02/26/2018   12:09 PM  GAD 7 : Generalized Anxiety Score  Nervous, Anxious, on Edge 1 2 3 1   Control/stop worrying 0 2 1 0  Worry too much - different things 1 2 1 1   Trouble relaxing 0 1 0 0  Restless 0 0 1 1  Easily annoyed or irritable 1 3 1  0  Afraid - awful might happen 0 1 0 0  Total GAD 7 Score 3 11 7 3   Anxiety Difficulty Not difficult at all Somewhat difficult Somewhat difficult Not difficult at all    GERD: she is currently taking pepcid 2 times a day. She says it is well controlled. She says that she does have flares.    Obesity: Her weight today is 224 lbs with a BMI of  37.31.  She is working on lifestyle modification.   Relevant past medical, surgical, family and social history reviewed and updated as indicated. Interim medical history since our last visit reviewed. Allergies and medications reviewed and updated.  Review of Systems  Constitutional: Negative for fever or weight change.  Respiratory: Negative for cough and shortness of breath.   Cardiovascular: Negative for chest pain or palpitations.  Gastrointestinal: Negative for abdominal pain, no bowel changes.  Musculoskeletal: Negative for gait problem or joint swelling.  Skin: Negative for rash.  Neurological: Negative for dizziness or headache.  No other specific complaints in a complete review of systems (except as listed in HPI above).      Objective:    BP 120/72   Pulse 87   Temp 99 F (37.2 C) (Oral)   Resp 18   Ht 5\' 5"  (1.651 m)   Wt  224 lb 3.2 oz (101.7 kg)   LMP 08/01/2022   SpO2 98%   BMI 37.31 kg/m   Wt Readings from Last 3 Encounters:  08/18/22 224 lb 3.2 oz (101.7 kg)  12/08/21 190 lb (86.2 kg)  06/03/21 233 lb 8 oz (105.9 kg)    Physical Exam  Constitutional: Patient appears well-developed and well-nourished. Obese  No distress.  HEENT: head atraumatic, normocephalic, pupils equal and reactive to light, neck supple Cardiovascular: Normal rate, regular rhythm and normal heart sounds.  No murmur heard. No BLE edema. Pulmonary/Chest: Effort normal and breath sounds normal. No respiratory distress. Abdominal: Soft.  There is no tenderness. Psychiatric: Patient has a normal mood and affect. behavior is normal. Judgment and thought content normal.  Results for orders placed or performed during the hospital encounter of 12/09/21  Lipase, blood  Result Value Ref Range   Lipase 27 11 - 51 U/L  Comprehensive metabolic panel  Result Value Ref Range   Sodium 137 135 - 145 mmol/L   Potassium 3.1 (L) 3.5 - 5.1 mmol/L   Chloride 103 98 - 111 mmol/L   CO2 24 22 - 32 mmol/L    Glucose, Bld 114 (H) 70 - 99 mg/dL   BUN 11 6 - 20 mg/dL   Creatinine, Ser 2.37 0.44 - 1.00 mg/dL   Calcium 9.3 8.9 - 62.8 mg/dL   Total Protein 8.1 6.5 - 8.1 g/dL   Albumin 4.4 3.5 - 5.0 g/dL   AST 22 15 - 41 U/L   ALT 20 0 - 44 U/L   Alkaline Phosphatase 47 38 - 126 U/L   Total Bilirubin 1.0 0.3 - 1.2 mg/dL   GFR, Estimated >31 >51 mL/min   Anion gap 10 5 - 15  CBC  Result Value Ref Range   WBC 11.1 (H) 4.0 - 10.5 K/uL   RBC 4.49 3.87 - 5.11 MIL/uL   Hemoglobin 12.9 12.0 - 15.0 g/dL   HCT 76.1 60.7 - 37.1 %   MCV 86.0 80.0 - 100.0 fL   MCH 28.7 26.0 - 34.0 pg   MCHC 33.4 30.0 - 36.0 g/dL   RDW 06.2 69.4 - 85.4 %   Platelets 397 150 - 400 K/uL   nRBC 0.0 0.0 - 0.2 %  Urinalysis, Routine w reflex microscopic Urine, Clean Catch  Result Value Ref Range   Color, Urine YELLOW (A) YELLOW   APPearance CLEAR (A) CLEAR   Specific Gravity, Urine 1.013 1.005 - 1.030   pH 6.0 5.0 - 8.0   Glucose, UA NEGATIVE NEGATIVE mg/dL   Hgb urine dipstick NEGATIVE NEGATIVE   Bilirubin Urine NEGATIVE NEGATIVE   Ketones, ur 5 (A) NEGATIVE mg/dL   Protein, ur NEGATIVE NEGATIVE mg/dL   Nitrite NEGATIVE NEGATIVE   Leukocytes,Ua NEGATIVE NEGATIVE  Pregnancy, urine  Result Value Ref Range   Preg Test, Ur NEGATIVE NEGATIVE      Assessment & Plan:   Problem List Items Addressed This Visit       Digestive   Gastroesophageal reflux disease without esophagitis    Continue current treatment       Relevant Medications   famotidine (PEPCID) 20 MG tablet     Other   Depression, major, recurrent (HCC) - Primary    Continue taking lexapro 20 mg daily, hydroxyzine 10 mg at night.        Relevant Medications   escitalopram (LEXAPRO) 20 MG tablet   hydrOXYzine (ATARAX) 10 MG tablet   Class 1 obesity due to excess  calories without serious comorbidity with body mass index (BMI) of 30.0 to 30.9 in adult    Continue working on lifestyle modification      Generalized anxiety disorder with panic  attacks    Continue taking lexapro 20 mg daily, hydroxyzine 10 mg at night.        Relevant Medications   escitalopram (LEXAPRO) 20 MG tablet   hydrOXYzine (ATARAX) 10 MG tablet   Other Visit Diagnoses     Screening for diabetes mellitus       Relevant Orders   COMPLETE METABOLIC PANEL WITH GFR   Hemoglobin A1c   Screening for cholesterol level       Relevant Orders   Lipid panel   Encounter for hepatitis C screening test for low risk patient       Relevant Orders   Hepatitis C antibody   Screening for HIV without presence of risk factors       Relevant Orders   HIV Antibody (routine testing w rflx)   Screening for deficiency anemia       Relevant Orders   CBC with Differential/Platelet   Need for influenza vaccination       Relevant Orders   Flu Vaccine QUAD 6+ mos PF IM (Fluarix Quad PF) (Completed)        Follow up plan: Return for cpe.

## 2022-08-18 NOTE — Assessment & Plan Note (Signed)
Continue working on lifestyle modification 

## 2022-08-18 NOTE — Assessment & Plan Note (Signed)
Continue taking lexapro 20 mg daily, hydroxyzine 10 mg at night.   

## 2022-08-19 LAB — CBC WITH DIFFERENTIAL/PLATELET
Absolute Monocytes: 547 cells/uL (ref 200–950)
Basophils Absolute: 50 cells/uL (ref 0–200)
Basophils Relative: 0.7 %
Eosinophils Absolute: 99 cells/uL (ref 15–500)
Eosinophils Relative: 1.4 %
HCT: 42 % (ref 35.0–45.0)
Hemoglobin: 14.3 g/dL (ref 11.7–15.5)
Lymphs Abs: 2258 cells/uL (ref 850–3900)
MCH: 29.3 pg (ref 27.0–33.0)
MCHC: 34 g/dL (ref 32.0–36.0)
MCV: 86.1 fL (ref 80.0–100.0)
MPV: 10.2 fL (ref 7.5–12.5)
Monocytes Relative: 7.7 %
Neutro Abs: 4146 cells/uL (ref 1500–7800)
Neutrophils Relative %: 58.4 %
Platelets: 361 10*3/uL (ref 140–400)
RBC: 4.88 10*6/uL (ref 3.80–5.10)
RDW: 12.4 % (ref 11.0–15.0)
Total Lymphocyte: 31.8 %
WBC: 7.1 10*3/uL (ref 3.8–10.8)

## 2022-08-19 LAB — COMPLETE METABOLIC PANEL WITH GFR
AG Ratio: 1.6 (calc) (ref 1.0–2.5)
ALT: 28 U/L (ref 6–29)
AST: 19 U/L (ref 10–30)
Albumin: 4.4 g/dL (ref 3.6–5.1)
Alkaline phosphatase (APISO): 60 U/L (ref 31–125)
BUN: 12 mg/dL (ref 7–25)
CO2: 28 mmol/L (ref 20–32)
Calcium: 9.6 mg/dL (ref 8.6–10.2)
Chloride: 104 mmol/L (ref 98–110)
Creat: 0.68 mg/dL (ref 0.50–0.96)
Globulin: 2.7 g/dL (calc) (ref 1.9–3.7)
Glucose, Bld: 86 mg/dL (ref 65–99)
Potassium: 4.6 mmol/L (ref 3.5–5.3)
Sodium: 138 mmol/L (ref 135–146)
Total Bilirubin: 0.4 mg/dL (ref 0.2–1.2)
Total Protein: 7.1 g/dL (ref 6.1–8.1)
eGFR: 123 mL/min/{1.73_m2} (ref >=60)

## 2022-08-19 LAB — LIPID PANEL
Cholesterol: 208 mg/dL — ABNORMAL HIGH (ref <200)
HDL: 58 mg/dL (ref >=50)
LDL Cholesterol (Calc): 132 mg/dL (calc) — ABNORMAL HIGH
Non-HDL Cholesterol (Calc): 150 mg/dL (calc) — ABNORMAL HIGH (ref <130)
Total CHOL/HDL Ratio: 3.6 (calc) (ref <5.0)
Triglycerides: 85 mg/dL (ref <150)

## 2022-08-19 LAB — HIV ANTIBODY (ROUTINE TESTING W REFLEX): HIV 1&2 Ab, 4th Generation: NONREACTIVE

## 2022-08-19 LAB — HEMOGLOBIN A1C
Hgb A1c MFr Bld: 5.8 % of total Hgb — ABNORMAL HIGH (ref <5.7)
Mean Plasma Glucose: 120 mg/dL
eAG (mmol/L): 6.6 mmol/L

## 2022-08-19 LAB — HEPATITIS C ANTIBODY: Hepatitis C Ab: NONREACTIVE

## 2022-08-25 NOTE — Progress Notes (Deleted)
Name: Yolanda Gardner   MRN: [id number]    DOB: December 27, 1995   Date:08/25/2022       Progress Note  Subjective  Chief Complaint  No chief complaint on file.   HPI  Patient presents for annual CPE.  Diet: *** Exercise: ***  Sleep: *** Last dental exam:*** Last eye exam: ***  [place] Office Visit from 08/18/2022 in [facility]  AUDIT-C Score 0      Depression: Phq 9 is  {Desc; negative/positive:13464}    08/18/2022    1:42 PM 06/03/2021    8:20 AM 07/07/2020   10:02 AM 04/22/2020   11:12 AM 01/01/2020    1:32 PM  Depression screen PHQ 2/9  Decreased Interest 0 2 0 0 1  Down, Depressed, Hopeless 1 2 0 1 1  PHQ - 2 Score 1 4 0 1 2  Altered sleeping 3 3 0 0 1  Tired, decreased energy 3 3 1 1 1   Change in appetite 1 1 0 0 0  Feeling bad or failure about yourself  1 2 0 1 0  Trouble concentrating 0 1 0  1  Moving slowly or fidgety/restless 0 0 0 0 1  Suicidal thoughts 0 0 0 0 0  PHQ-9 Score 9 14 1 3 6   Difficult doing work/chores Somewhat difficult Somewhat difficult Not difficult at all Somewhat difficult Somewhat difficult   Hypertension: BP Readings from Last 3 Encounters:  08/18/22 120/72  12/09/21 116/60  12/08/21 (!) 141/98   Obesity: Wt Readings from Last 3 Encounters:  08/18/22 224 lb 3.2 oz (101.7 kg)  12/08/21 190 lb (86.2 kg)  06/03/21 233 lb 8 oz (105.9 kg)   BMI Readings from Last 3 Encounters:  08/18/22 37.31 kg/m  12/08/21 31.62 kg/m  06/03/21 38.86 kg/m     Vaccines:  HPV: up to at age 15 , ask insurance if age between 25-45  Shingrix: 25-64 yo and ask insurance if covered when patient above 71 yo Pneumonia:  educated and discussed with patient. Flu:  educated and discussed with patient.  Hep C Screening: 08/18/2022 STD testing and prevention (HIV/chl/gon/syphilis): 08/18/2022 Intimate partner violence:*** Sexual History : Menstrual History/LMP/Abnormal Bleeding:  Incontinence Symptoms:   Breast cancer:  -  Last Mammogram: no concerns, does not qualify - BRCA gene screening: none  Osteoporosis: Discussed high calcium and vitamin D supplementation, weight bearing exercises  Cervical cancer screening: 03/29/2018, due  Skin cancer: Discussed monitoring for atypical lesions  Colorectal cancer: no concerns, does not qualify   Lung cancer:   Low Dose CT Chest recommended if Age 38-80 years, 20 pack-year currently smoking OR have quit w/in 15years. Patient does not qualify.   ECG: 12/10/2021  Advanced Care Planning: A voluntary discussion about advance care planning including the explanation and discussion of advance directives.  Discussed health care proxy and Living will, and the patient was able to identify a health care proxy as ***.  Patient {DOES_DOES SJG:28366} have a living will at present time. If patient does have living will, I have requested they bring this to the clinic to be scanned in to their chart.  Lipids: Lab Results  Component Value Date   CHOL 208 (H) 08/18/2022   CHOL 207 (H) 03/29/2018   CHOL 187 (H) 04/24/2015   Lab Results  Component Value Date   HDL 58 08/18/2022   HDL 39 (L) 03/29/2018   HDL 45 04/24/2015   Lab Results  Component Value Date   LDLCALC 132 (H)  08/18/2022   LDLCALC 140 (H) 03/29/2018   LDLCALC 120 (H) 04/24/2015   Lab Results  Component Value Date   TRIG 85 08/18/2022   TRIG 150 (H) 03/29/2018   TRIG 112 (H) 04/24/2015   Lab Results  Component Value Date   CHOLHDL 3.6 08/18/2022   CHOLHDL 5.3 (H) 03/29/2018   No results found for: "LDLDIRECT"  Glucose: Glucose, Bld  Date Value Ref Range Status  08/18/2022 86 65 - 99 mg/dL Final    Comment:    .            Fasting reference interval .   12/09/2021 114 (H) 70 - 99 mg/dL Final    Comment:    Glucose reference range applies only to samples taken after fasting for at least 8 hours.  12/08/2021 166 (H) 70 - 99 mg/dL Final    Comment:    Glucose reference range applies only to samples  taken after fasting for at least 8 hours.    Patient Active Problem List   Diagnosis Date Noted   Gastroesophageal reflux disease without esophagitis 04/22/2020   Generalized anxiety disorder with panic attacks 01/01/2020   Class 1 obesity due to excess calories without serious comorbidity with body mass index (BMI) of 30.0 to 30.9 in adult 03/29/2018   Vitamin D deficiency 05/25/2015   Depression, major, recurrent ([place]) 04/24/2015    Past Surgical History:  Procedure Laterality Date   WISDOM TOOTH EXTRACTION      Family History  Problem Relation Age of Onset   Depression Mother    Hypertension Maternal Grandmother    Diabetes Maternal Aunt    Stomach cancer Neg Hx    Colon cancer Neg Hx    Esophageal cancer Neg Hx    Pancreatic cancer Neg Hx     Social History   Socioeconomic History   Marital status: Single    Spouse name: Not on file   Number of children: Not on file   Years of education: Not on file   Highest education level: Not on file  Occupational History   Not on file  Tobacco Use   Smoking status: Never   Smokeless tobacco: Never  Vaping Use   Vaping Use: Never used  Substance and Sexual Activity   Alcohol use: Yes    Comment: Social   Drug use: Yes    Types: Marijuana    Comment: 1 x a week   Sexual activity: Not Currently  Other Topics Concern   Not on file  Social History Narrative   Not on file   Social Determinants of Health   Financial Resource Strain: Not on file  Food Insecurity: Not on file  Transportation Needs: Not on file  Physical Activity: Not on file  Stress: Not on file  Social Connections: Not on file  Intimate Partner Violence: Not on file     Current Outpatient Medications:    escitalopram (LEXAPRO) 20 MG tablet, TAKE 1 TABLET(20 MG) BY MOUTH DAILY, Disp: 90 tablet, Rfl: 3   famotidine (PEPCID) 20 MG tablet, TAKE 1 TABLET(20 MG) BY MOUTH TWICE DAILY, Disp: 60 tablet, Rfl: 3   hydrOXYzine (ATARAX) 10 MG tablet, Take 1  tablet (10 mg total) by mouth 3 (three) times daily as needed., Disp: 90 tablet, Rfl: 3  No Known Allergies   ROS  Constitutional: Negative for fever or weight change.  Respiratory: Negative for cough and shortness of breath.   Cardiovascular: Negative for chest pain or palpitations.  Gastrointestinal:  Negative for abdominal pain, no bowel changes.  Musculoskeletal: Negative for gait problem or joint swelling.  Skin: Negative for rash.  Neurological: Negative for dizziness or headache.  No other specific complaints in a complete review of systems (except as listed in HPI above).   Objective  There were no vitals filed for this visit.  There is no height or weight on file to calculate BMI.  Physical Exam Constitutional: Patient appears well-developed and well-nourished. No distress.  HENT: Head: Normocephalic and atraumatic. Ears: B TMs ok, no erythema or effusion; Nose: Nose normal. Mouth/Throat: Oropharynx is clear and moist. No oropharyngeal exudate.  Eyes: Conjunctivae and EOM are normal. Pupils are equal, round, and reactive to light. No scleral icterus.  Neck: Normal range of motion. Neck supple. No JVD present. No thyromegaly present.  Cardiovascular: Normal rate, regular rhythm and normal heart sounds.  No murmur heard. No BLE edema. Pulmonary/Chest: Effort normal and breath sounds normal. No respiratory distress. Abdominal: Soft. Bowel sounds are normal, no distension. There is no tenderness. no masses Breast: no lumps or masses, no nipple discharge or rashes FEMALE GENITALIA:  External genitalia normal External urethra normal Vaginal vault normal without discharge or lesions Cervix normal without discharge or lesions Bimanual exam normal without masses RECTAL: no rectal masses or hemorrhoids Musculoskeletal: Normal range of motion, no joint effusions. No gross deformities Neurological: he is alert and oriented to person, place, and time. No cranial nerve deficit.  Coordination, balance, strength, speech and gait are normal.  Skin: Skin is warm and dry. No rash noted. No erythema.  Psychiatric: Patient has a normal mood and affect. behavior is normal. Judgment and thought content normal.   Recent Results (from the past 2160 hour(s))  CBC with Differential/Platelet     Status: None   Collection Time: 08/18/22  1:59 PM  Result Value Ref Range   WBC 7.1 3.8 - 10.8 Thousand/uL   RBC 4.88 3.80 - 5.10 Million/uL   Hemoglobin 14.3 11.7 - 15.5 g/dL   HCT 42.0 35.0 - 45.0 %   MCV 86.1 80.0 - 100.0 fL   MCH 29.3 27.0 - 33.0 pg   MCHC 34.0 32.0 - 36.0 g/dL   RDW 12.4 11.0 - 15.0 %   Platelets 361 140 - 400 Thousand/uL   MPV 10.2 7.5 - 12.5 fL   Neutro Abs 4,146 1,500 - 7,800 cells/uL   Lymphs Abs 2,258 850 - 3,900 cells/uL   Absolute Monocytes 547 200 - 950 cells/uL   Eosinophils Absolute 99 15 - 500 cells/uL   Basophils Absolute 50 0 - 200 cells/uL   Neutrophils Relative % 58.4 %   Total Lymphocyte 31.8 %   Monocytes Relative 7.7 %   Eosinophils Relative 1.4 %   Basophils Relative 0.7 %  COMPLETE METABOLIC PANEL WITH GFR     Status: None   Collection Time: 08/18/22  1:59 PM  Result Value Ref Range   Glucose, Bld 86 65 - 99 mg/dL    Comment: .            Fasting reference interval .    BUN 12 7 - 25 mg/dL   Creat 0.68 0.50 - 0.96 mg/dL   eGFR 123 > OR = 60 mL/min/1.25m   BUN/Creatinine Ratio SEE NOTE: 6 - 22 (calc)    Comment:    Not Reported: BUN and Creatinine are within    reference range. .    Sodium 138 135 - 146 mmol/L   Potassium 4.6 3.5 - 5.3  mmol/L   Chloride 104 98 - 110 mmol/L   CO2 28 20 - 32 mmol/L   Calcium 9.6 8.6 - 10.2 mg/dL   Total Protein 7.1 6.1 - 8.1 g/dL   Albumin 4.4 3.6 - 5.1 g/dL   Globulin 2.7 1.9 - 3.7 g/dL (calc)   AG Ratio 1.6 1.0 - 2.5 (calc)   Total Bilirubin 0.4 0.2 - 1.2 mg/dL   Alkaline phosphatase (APISO) 60 31 - 125 U/L   AST 19 10 - 30 U/L   ALT 28 6 - 29 U/L  Lipid panel     Status: Abnormal    Collection Time: 08/18/22  1:59 PM  Result Value Ref Range   Cholesterol 208 (H) <200 mg/dL   HDL 58 > OR = 50 mg/dL   Triglycerides 85 <150 mg/dL   LDL Cholesterol (Calc) 132 (H) mg/dL (calc)    Comment: Reference range: <100 . Desirable range <100 mg/dL for primary prevention;   <70 mg/dL for patients with CHD or diabetic patients  with > or = 2 CHD risk factors. Marland Kitchen LDL-C is now calculated using the Martin-Hopkins  calculation, which is a validated novel method providing  better accuracy than the Friedewald equation in the  estimation of LDL-C.  Cresenciano Genre et al. Annamaria Helling. 8250;539(76): 2061-2068  (http://education.[facility].com/faq/FAQ164)    Total CHOL/HDL Ratio 3.6 <5.0 (calc)   Non-HDL Cholesterol (Calc) 150 (H) <130 mg/dL (calc)    Comment: For patients with diabetes plus 1 major ASCVD risk  factor, treating to a non-HDL-C goal of <100 mg/dL  (LDL-C of <70 mg/dL) is considered a therapeutic  option.   Hemoglobin A1c     Status: Abnormal   Collection Time: 08/18/22  1:59 PM  Result Value Ref Range   Hgb A1c MFr Bld 5.8 (H) <5.7 % of total Hgb    Comment: For someone without known diabetes, a hemoglobin  A1c value between 5.7% and 6.4% is consistent with prediabetes and should be confirmed with a  follow-up test. . For someone with known diabetes, a value <7% indicates that their diabetes is well controlled. A1c targets should be individualized based on duration of diabetes, age, comorbid conditions, and other considerations. . This assay result is consistent with an increased risk of diabetes. . Currently, no consensus exists regarding use of hemoglobin A1c for diagnosis of diabetes for children. .    Mean Plasma Glucose 120 mg/dL   eAG (mmol/L) 6.6 mmol/L  HIV Antibody (routine testing w rflx)     Status: None   Collection Time: 08/18/22  1:59 PM  Result Value Ref Range   HIV 1&2 Ab, 4th Generation NON-REACTIVE NON-REACTIVE    Comment: HIV-1 antigen  and HIV-1/HIV-2 antibodies were not detected. There is no laboratory evidence of HIV infection. Marland Kitchen PLEASE NOTE: This information has been disclosed to you from records whose confidentiality may be protected by state law.  If your state requires such protection, then the state law prohibits you from making any further disclosure of the information without the specific written consent of the person to whom it pertains, or as otherwise permitted by law. A general authorization for the release of medical or other information is NOT sufficient for this purpose. . For additional information please refer to http://education.[facility].com/faq/FAQ106 (This link is being provided for informational/ educational purposes only.) . Marland Kitchen The performance of this assay has not been clinically validated in patients less than 51 years old. .   Hepatitis C antibody     Status: None  Collection Time: 08/18/22  1:59 PM  Result Value Ref Range   Hepatitis C Ab NON-REACTIVE NON-REACTIVE    Comment: . HCV antibody was non-reactive. There is no laboratory  evidence of HCV infection. . In most cases, no further action is required. However, if recent HCV exposure is suspected, a test for HCV RNA (test code [phone]) is suggested. . For additional information please refer to http://education.[facility].com/faq/FAQ22v1 (This link is being provided for informational/ educational purposes only.) .      Fall Risk:    08/18/2022    1:42 PM 06/03/2021    8:20 AM 07/07/2020   10:01 AM 04/22/2020   11:11 AM 01/01/2020    1:32 PM  Fall Risk   Falls in the past year? 0 0 0 0 0  Number falls in past yr: 0 0 0 0 0  Injury with Fall? 0 0 0 0 0  Follow up Falls evaluation completed Falls evaluation completed  Falls evaluation completed Falls evaluation completed   ***  Functional Status Survey:   ***  Assessment & Plan  There are no diagnoses linked to this encounter.  -USPSTF grade A and  B recommendations reviewed with patient; age-appropriate recommendations, preventive care, screening tests, etc discussed and encouraged; healthy living encouraged; see AVS for patient education given to patient -Discussed importance of 150 minutes of physical activity weekly, eat two servings of fish weekly, eat one serving of tree nuts ( cashews, pistachios, pecans, almonds.Marland Kitchen) every other day, eat 6 servings of fruit/vegetables daily and drink plenty of water and avoid sweet beverages.   -Reviewed Health Maintenance: due for pap

## 2022-08-26 ENCOUNTER — Encounter: Payer: Commercial Managed Care - HMO | Admitting: Nurse Practitioner

## 2022-08-26 DIAGNOSIS — Z0000 Encounter for general adult medical examination without abnormal findings: Secondary | ICD-10-CM

## 2023-03-08 ENCOUNTER — Encounter (HOSPITAL_COMMUNITY): Payer: Self-pay

## 2023-03-08 ENCOUNTER — Emergency Department (HOSPITAL_COMMUNITY)
Admission: EM | Admit: 2023-03-08 | Discharge: 2023-03-08 | Discharge status: Against Medical Advice | Payer: Self-pay | Attending: Emergency Medicine | Admitting: Emergency Medicine

## 2023-03-08 ENCOUNTER — Other Ambulatory Visit: Payer: Self-pay

## 2023-03-08 DIAGNOSIS — R1110 Vomiting, unspecified: Secondary | ICD-10-CM | POA: Insufficient documentation

## 2023-03-08 DIAGNOSIS — R002 Palpitations: Secondary | ICD-10-CM | POA: Insufficient documentation

## 2023-03-08 DIAGNOSIS — R109 Unspecified abdominal pain: Secondary | ICD-10-CM | POA: Insufficient documentation

## 2023-03-08 DIAGNOSIS — Z5321 Procedure and treatment not carried out due to patient leaving prior to being seen by health care provider: Secondary | ICD-10-CM | POA: Insufficient documentation

## 2023-03-08 LAB — COMPREHENSIVE METABOLIC PANEL
ALT: 26 U/L (ref 0–44)
AST: 27 U/L (ref 15–41)
Albumin: 4.3 g/dL (ref 3.5–5.0)
Alkaline Phosphatase: 58 U/L (ref 38–126)
Anion gap: 15 (ref 5–15)
BUN: 13 mg/dL (ref 6–20)
CO2: 18 mmol/L — ABNORMAL LOW (ref 22–32)
Calcium: 9.5 mg/dL (ref 8.9–10.3)
Chloride: 107 mmol/L (ref 98–111)
Creatinine, Ser: 0.9 mg/dL (ref 0.44–1.00)
GFR, Estimated: >60 mL/min (ref >60)
Glucose, Bld: 160 mg/dL — ABNORMAL HIGH (ref 70–99)
Potassium: 3 mmol/L — ABNORMAL LOW (ref 3.5–5.1)
Sodium: 140 mmol/L (ref 135–145)
Total Bilirubin: 0.8 mg/dL (ref 0.3–1.2)
Total Protein: 8 g/dL (ref 6.5–8.1)

## 2023-03-08 LAB — CBC
HCT: 44.2 % (ref 36.0–46.0)
Hemoglobin: 14.8 g/dL (ref 12.0–15.0)
MCH: 28.7 pg (ref 26.0–34.0)
MCHC: 33.5 g/dL (ref 30.0–36.0)
MCV: 85.8 fL (ref 80.0–100.0)
Platelets: 493 10*3/uL — ABNORMAL HIGH (ref 150–400)
RBC: 5.15 MIL/uL — ABNORMAL HIGH (ref 3.87–5.11)
RDW: 12.8 % (ref 11.5–15.5)
WBC: 15 10*3/uL — ABNORMAL HIGH (ref 4.0–10.5)
nRBC: 0 % (ref 0.0–0.2)

## 2023-03-08 LAB — HCG, SERUM, QUALITATIVE: Preg, Serum: NEGATIVE

## 2023-03-08 LAB — LIPASE, BLOOD: Lipase: 29 U/L (ref 11–51)

## 2023-03-08 NOTE — ED Notes (Signed)
  Pt. Stated that she wanted to go home and just lay down bed. Tech explained to pt. That she would be leaving AMA and she understood and said that her ride was already here.

## 2023-03-08 NOTE — ED Triage Notes (Signed)
Pt c/o vomiting and abd painx2d. PT states she can't keep anything down. Pt states she's been having heart palpitations started yesterday

## 2023-03-09 ENCOUNTER — Other Ambulatory Visit: Payer: Self-pay

## 2023-03-09 ENCOUNTER — Encounter (HOSPITAL_COMMUNITY): Payer: Self-pay | Admitting: Emergency Medicine

## 2023-03-09 ENCOUNTER — Emergency Department (HOSPITAL_COMMUNITY)
Admission: EM | Admit: 2023-03-09 | Discharge: 2023-03-09 | Disposition: A | Discharge status: Home / Self Care | Payer: Self-pay | Attending: Emergency Medicine | Admitting: Emergency Medicine

## 2023-03-09 DIAGNOSIS — D72829 Elevated white blood cell count, unspecified: Secondary | ICD-10-CM | POA: Insufficient documentation

## 2023-03-09 DIAGNOSIS — R109 Unspecified abdominal pain: Secondary | ICD-10-CM | POA: Insufficient documentation

## 2023-03-09 DIAGNOSIS — R112 Nausea with vomiting, unspecified: Secondary | ICD-10-CM | POA: Insufficient documentation

## 2023-03-09 LAB — COMPREHENSIVE METABOLIC PANEL
ALT: 40 U/L (ref 0–44)
AST: 39 U/L (ref 15–41)
Albumin: 4.3 g/dL (ref 3.5–5.0)
Alkaline Phosphatase: 52 U/L (ref 38–126)
Anion gap: 19 — ABNORMAL HIGH (ref 5–15)
BUN: 12 mg/dL (ref 6–20)
CO2: 19 mmol/L — ABNORMAL LOW (ref 22–32)
Calcium: 9.2 mg/dL (ref 8.9–10.3)
Chloride: 99 mmol/L (ref 98–111)
Creatinine, Ser: 0.88 mg/dL (ref 0.44–1.00)
GFR, Estimated: >60 mL/min (ref >60)
Glucose, Bld: 106 mg/dL — ABNORMAL HIGH (ref 70–99)
Potassium: 2.9 mmol/L — ABNORMAL LOW (ref 3.5–5.1)
Sodium: 137 mmol/L (ref 135–145)
Total Bilirubin: 1 mg/dL (ref 0.3–1.2)
Total Protein: 7.8 g/dL (ref 6.5–8.1)

## 2023-03-09 LAB — CBC WITH DIFFERENTIAL/PLATELET
Abs Immature Granulocytes: 0.05 10*3/uL (ref 0.00–0.07)
Basophils Absolute: 0.1 10*3/uL (ref 0.0–0.1)
Basophils Relative: 1 %
Eosinophils Absolute: 0 10*3/uL (ref 0.0–0.5)
Eosinophils Relative: 0 %
HCT: 41.8 % (ref 36.0–46.0)
Hemoglobin: 14.4 g/dL (ref 12.0–15.0)
Immature Granulocytes: 1 %
Lymphocytes Relative: 20 %
Lymphs Abs: 2.1 10*3/uL (ref 0.7–4.0)
MCH: 30 pg (ref 26.0–34.0)
MCHC: 34.4 g/dL (ref 30.0–36.0)
MCV: 87.1 fL (ref 80.0–100.0)
Monocytes Absolute: 0.8 10*3/uL (ref 0.1–1.0)
Monocytes Relative: 7 %
Neutro Abs: 7.8 10*3/uL — ABNORMAL HIGH (ref 1.7–7.7)
Neutrophils Relative %: 71 %
Platelets: 437 10*3/uL — ABNORMAL HIGH (ref 150–400)
RBC: 4.8 MIL/uL (ref 3.87–5.11)
RDW: 12.5 % (ref 11.5–15.5)
WBC: 10.8 10*3/uL — ABNORMAL HIGH (ref 4.0–10.5)
nRBC: 0 % (ref 0.0–0.2)

## 2023-03-09 LAB — MAGNESIUM: Magnesium: 2.1 mg/dL (ref 1.7–2.4)

## 2023-03-09 LAB — HCG, SERUM, QUALITATIVE: Preg, Serum: NEGATIVE

## 2023-03-09 LAB — LIPASE, BLOOD: Lipase: 29 U/L (ref 11–51)

## 2023-03-09 MED ORDER — DIPHENHYDRAMINE HCL 50 MG/ML IJ SOLN
50.0000 mg | Freq: Once | INTRAMUSCULAR | Status: AC
  Administered 2023-03-09: 50 mg via INTRAVENOUS
  Filled 2023-03-09: qty 1

## 2023-03-09 MED ORDER — DROPERIDOL 2.5 MG/ML IJ SOLN
1.2500 mg | Freq: Once | INTRAMUSCULAR | Status: AC
  Administered 2023-03-09: 1.25 mg via INTRAVENOUS
  Filled 2023-03-09: qty 2

## 2023-03-09 MED ORDER — PROMETHAZINE HCL 25 MG RE SUPP: 25.0000 mg | Freq: Four times a day (QID) | RECTAL | 0 refills | Status: DC | PRN

## 2023-03-09 MED ORDER — LACTATED RINGERS IV BOLUS
1000.0000 mL | Freq: Once | INTRAVENOUS | Status: AC
  Administered 2023-03-09: 1000 mL via INTRAVENOUS

## 2023-03-09 NOTE — ED Triage Notes (Signed)
Patient arrives ambulatory by POV c/o generalized abdominal pain along with nausea and vomiting x 4 days. States she came yesterday but had to leave due to her anxiety. Requesting IV anxiety medicine. States she smoked marijuana about 5 days ago when symptoms started.

## 2023-03-09 NOTE — Discharge Instructions (Addendum)
You are seen today for nausea, vomiting.  While you are here we did physical exam, monitor your vital signs, check labs, and gave you fluids and medications to help with your nausea.  Overall your workup was reassuring against any emergent cause for your nausea and vomiting.  Recommend that you stop using marijuana as it may be contributing to your nausea and vomiting.  Please plan to follow-up with your primary care doctor as soon as you are able to get an appointment next week.  Return to the emergency department if you have any new or worsening symptoms including development of the fever, nausea that is not controlled with the medication of sending home with, or any other concerns.  We are happy to reevaluate you.

## 2023-03-09 NOTE — ED Provider Notes (Signed)
[place] EMERGENCY DEPARTMENT AT [facility] Provider Note  MDM   HPI/ROS:  Yolanda Gardner is a 27 y.o. female with a medical history as below who presents with complaint of abdominal pain, nausea, vomiting for the last 5 days.  Reports she has a history of cannabinoid hyperemesis syndrome and smoked for about 5 days ago, since then she has been having constant nausea that is refractory to anything she is tried.  She does report some increasing abdominal pain, and states she feels like she has "a lot of acid in her stomach."  Patient states she presented here yesterday, however she left prior to being evaluated because of anxiety.  She reports that this has happened before and caused her to be very anxious and requests IV anxiolytic.  Physical exam is notable for: - No abdominal tenderness to palpation - Belching - Anxious  On my initial evaluation, patient is:  -Vital signs stable. Patient afebrile, hemodynamically stable, and non-toxic appearing.  This patient's current presentation, including their history and physical exam, is most consistent with cannabinoid hyperemesis syndrome. Differentials include viral gastroenteritis.  History and physical exam are not consistent with intra-abdominal catastrophe such as vascular compromise, small or large bowel obstruction, overall well-appearing non-peritoneal.  She is not pregnant.  She does appear anxious, however she is easily de-escalated.   Droperidol given for emesis, liter bolus for hypotension.  Patient developed akathisia's and was given 50 of Benadryl.  On reassessment she was feeling much better.  Discussed plan for discharge with the patient and she was in agreement with the plan, requested prescription for an antiemetic to go home with.  Offered Zofran which she declined however she was okay with Phenergan suppositories.  Interpretations, interventions, and the patient's course of care are documented below.    Clinical  Course as of 03/09/23 2254  Thu Mar 09, 2023  1700 CBC with Differential(!) No significant abnormality, mild leukocytosis likely stress [BB]    Clinical Course User Index [BB] Fayrene Helper, MD     Disposition:  I discussed the plan for discharge with the patient and/or their surrogate at bedside prior to discharge and they were in agreement with the plan and verbalized understanding of the return precautions provided. All questions answered to the best of my ability. Ultimately, the patient was discharged in stable condition with stable vital signs. I am reassured that they are capable of close follow up and good social support at home.   Clinical Impression:  1. Nausea and vomiting, unspecified vomiting type     Rx / DC Orders ED Discharge Orders          Ordered    promethazine (PHENERGAN) 25 MG suppository  Every 6 hours PRN        03/09/23 1815            The plan for this patient was discussed with Dr. Adela Lank, who voiced agreement and who oversaw evaluation and treatment of this patient.   Clinical Complexity A medically appropriate history, review of systems, and physical exam was performed.  My independent interpretations of EKG, labs, and radiology are documented in the ED course above.   If decision rules were used in this patient's evaluation, they are listed below.   Click here for ABCD2, HEART and other calculatorsREFRESH Note before signing   Patient's presentation is most consistent with acute complicated illness / injury requiring diagnostic workup.  Medical Decision Making Amount and/or Complexity of Data Reviewed Labs:  Decision-making details  documented in ED Course.  Risk Prescription drug management.    HPI/ROS      See MDM section for pertinent HPI and ROS. A complete ROS was performed with pertinent positives/negatives noted above.   Past Medical History:  Diagnosis Date   Anxiety    Aortic atherosclerosis (HCC)    Depression     Sinus infection    Tendonitis     Past Surgical History:  Procedure Laterality Date   WISDOM TOOTH EXTRACTION        Physical Exam   Vitals:   03/09/23 1249 03/09/23 1251 03/09/23 1815  BP: (!) 131/111  134/81  Pulse: 80  85  Resp: 20  19  Temp: 99 F (37.2 C)  98.7 F (37.1 C)  TempSrc:   Oral  SpO2: 97%  99%  Weight:  101.7 kg   Height:  5\' 5"  (1.651 m)     Physical Exam Vitals and nursing note reviewed.  Constitutional:      General: She is not in acute distress.    Appearance: She is well-developed.  HENT:     Head: Normocephalic and atraumatic.  Eyes:     Conjunctiva/sclera: Conjunctivae normal.  Cardiovascular:     Rate and Rhythm: Normal rate and regular rhythm.     Heart sounds: No murmur heard. Pulmonary:     Effort: Pulmonary effort is normal. No respiratory distress.     Breath sounds: Normal breath sounds.  Abdominal:     General: Bowel sounds are normal.     Palpations: Abdomen is soft. There is no shifting dullness or mass.     Tenderness: There is no abdominal tenderness. There is no guarding or rebound.     Hernia: No hernia is present.  Musculoskeletal:        General: No swelling.     Cervical back: Neck supple.  Skin:    General: Skin is warm and dry.     Capillary Refill: Capillary refill takes less than 2 seconds.  Neurological:     Mental Status: She is alert.  Psychiatric:        Mood and Affect: Mood normal.      Procedures   If procedures were preformed on this patient, they are listed below:  Procedures   Fayrene Helper, MD Emergency Medicine PGY-2   Please note that this documentation was produced with the assistance of voice-to-text technology and may contain errors.    Fayrene Helper, MD 03/09/23 2256    Melene Plan, DO 03/09/23 2259

## 2023-03-09 NOTE — ED Provider Triage Note (Signed)
Emergency Medicine Provider Triage Evaluation Note  Yolanda Gardner , a 27 y.o. female  was evaluated in triage.  Pt complains of diffuse abdominal pain but mainly complaining of nausea and vomiting.  Patient reports that she has a history of cannabinoid induced hyperemesis syndrome and last was marijuana Tuesday and her symptoms began.  She is having 5-6 episodes of emesis a day.  She reports that she is still urinating however it is dark.  No dysuria or hematuria.  No diarrhea or constipation.  She is unable to keep down any food or fluids.  She tried Zofran at home without relief of symptoms.  No fevers.  Denies any hematemesis, hematochezia, melena, or coffee-ground emesis.  Review of Systems  Positive:  Negative:   Physical Exam  BP (!) 131/111 (BP Location: Right Arm)   Pulse 80   Temp 99 F (37.2 C)   Resp 20   Ht 5\' 5"  (1.651 m)   Wt 101.7 kg   LMP 03/09/2023   SpO2 97%   BMI 37.31 kg/m  Gen:   Awake, no distress   Resp:  Normal effort  MSK:   Moves extremities without difficulty  Other:  Abdomen soft.  Mild tenderness palpation diffusely.  No guarding or rebound.  Dry mucous membranes.  Medical Decision Making  Medically screening exam initiated at 2:51 PM.  Appropriate orders placed.  Yolanda Gardner was informed that the remainder of the evaluation will be completed by another provider, this initial triage assessment does not replace that evaluation, and the importance of remaining in the ED until their evaluation is complete.  Labs ordered.  Patient has well-documented history of cannabinoid induced hyperemesis syndrome.   Achille Rich, PA-C 03/09/23 1453

## 2023-04-10 IMAGING — US US ABDOMEN LIMITED
1 series · 14 of 25 positions shown · non-contrast
Comparison: CT Abdomen and Pelvis 04/06/2020. Ultrasound
01/28/2020.

CLINICAL DATA: 25-year-old female with abdominal pain.

EXAM:
ULTRASOUND ABDOMEN LIMITED RIGHT UPPER QUADRANT

[Series 1: us abdomen limited ruq (liver/gb) · 14 of 84 slices shown]
[im 1/84]
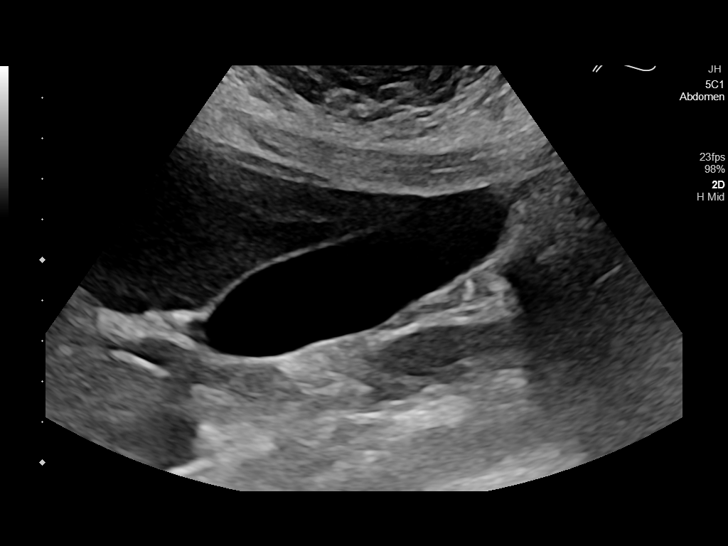
[im 7/84]
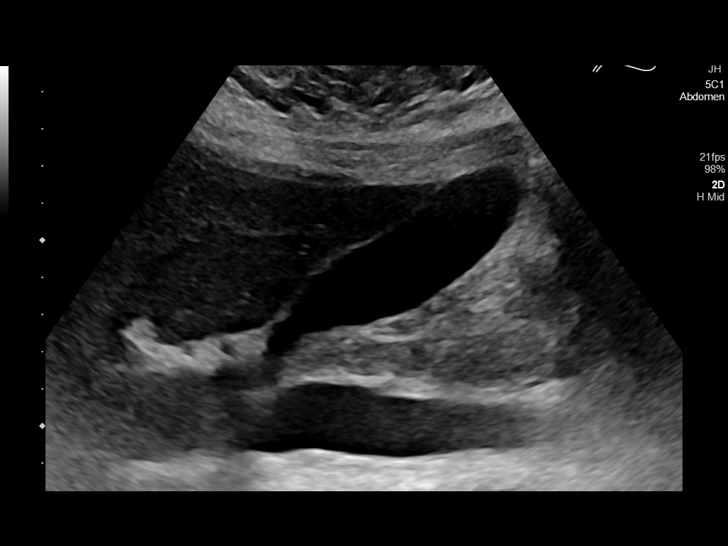
[im 14/84]
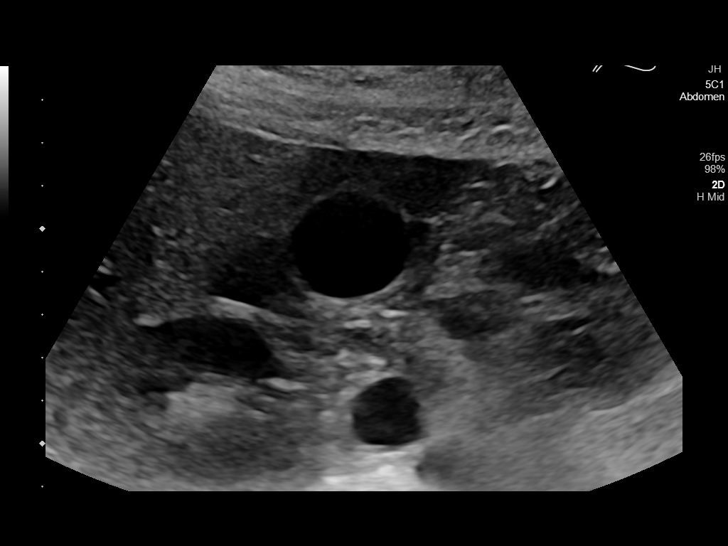
[im 21/84]
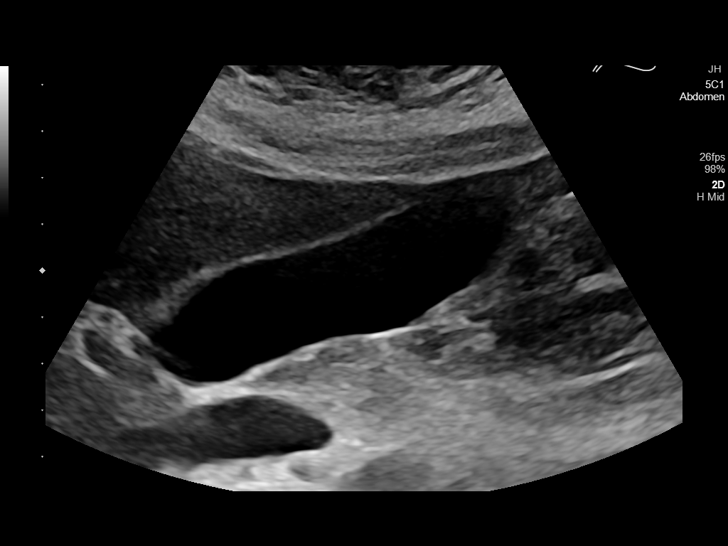
[im 28/84]
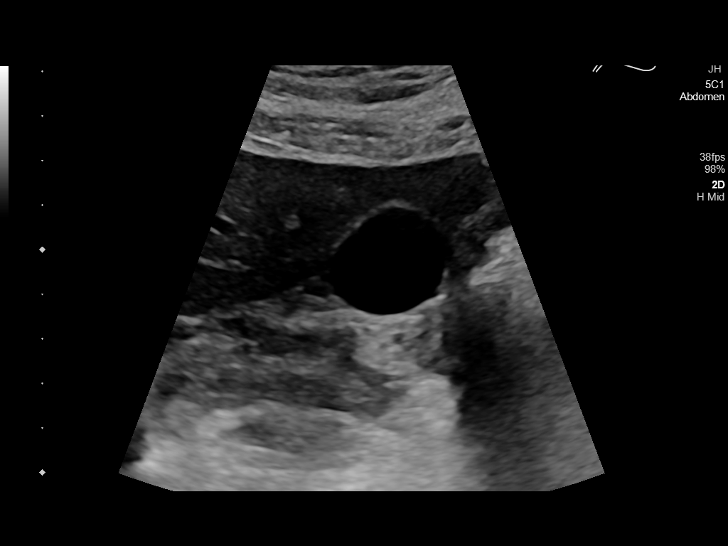
[im 32/84]
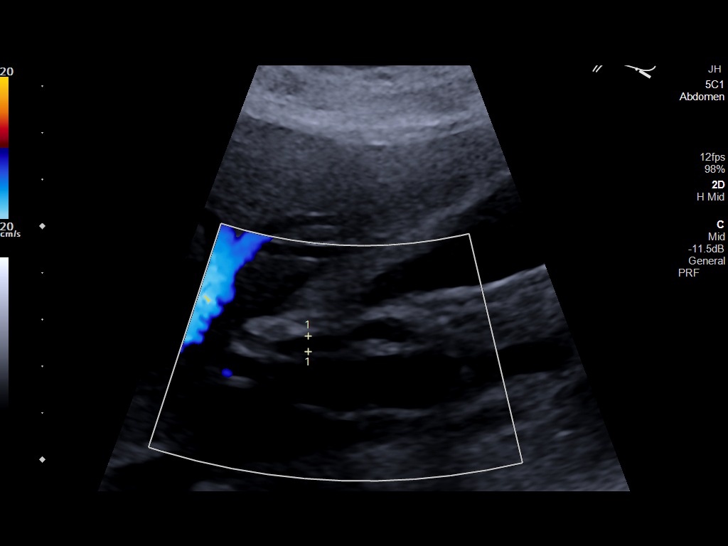
[im 39/84]
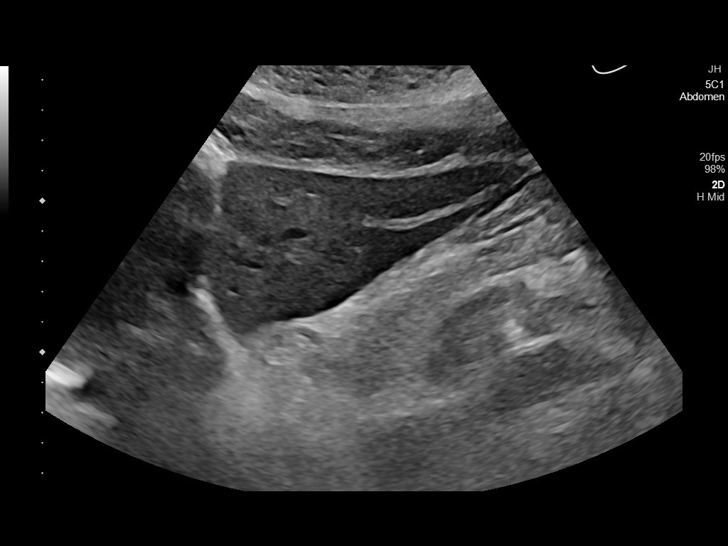
[im 45/84]
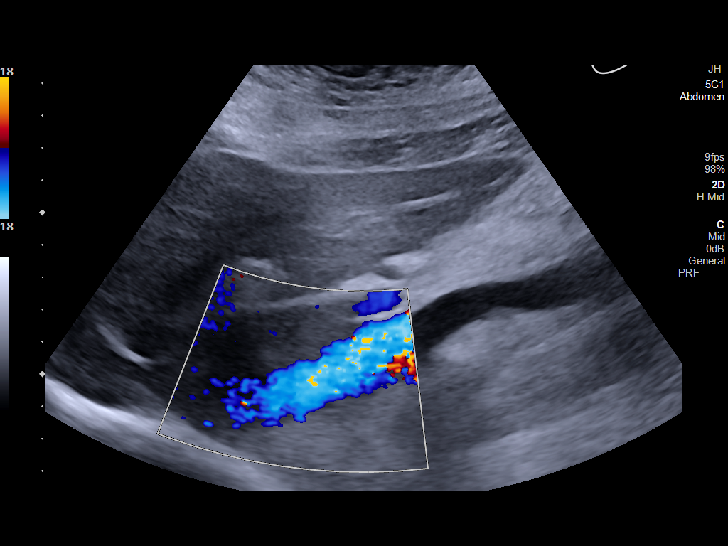
[im 52/84]
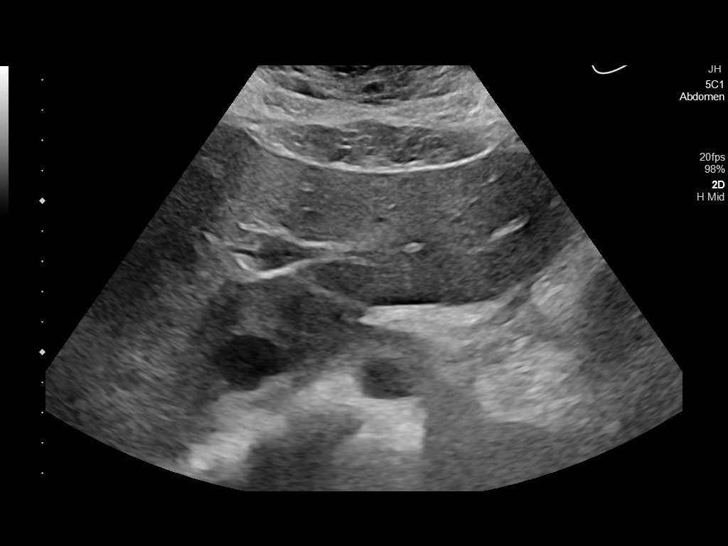
[im 56/84]
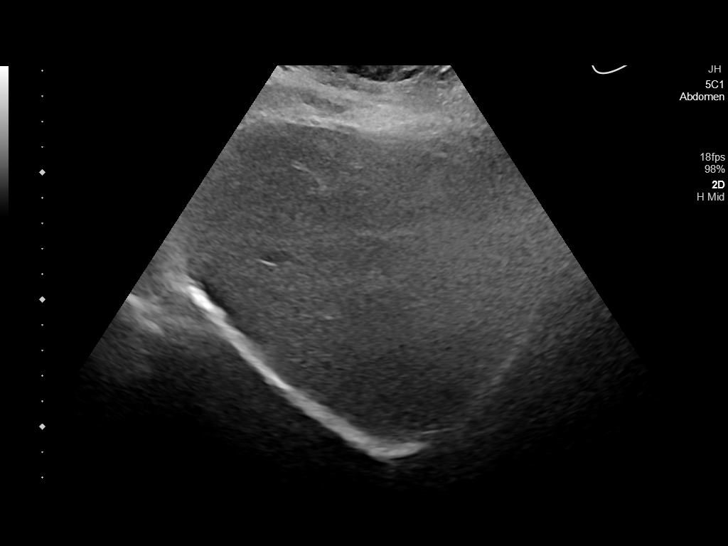
[im 63/84]
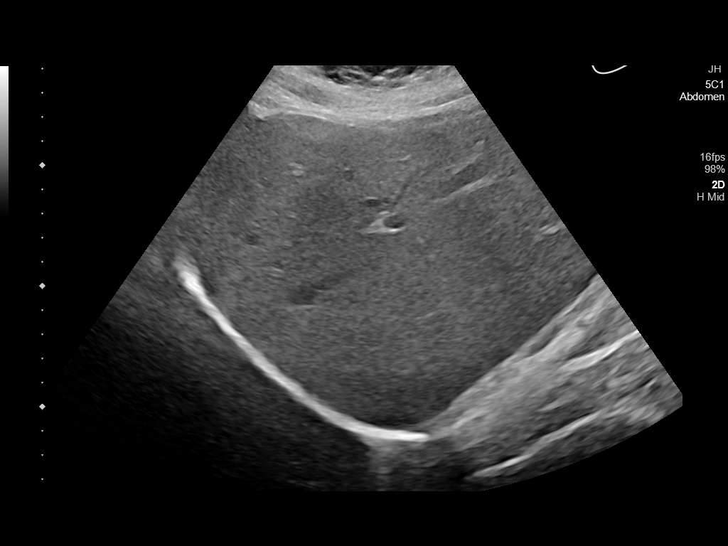
[im 70/84]
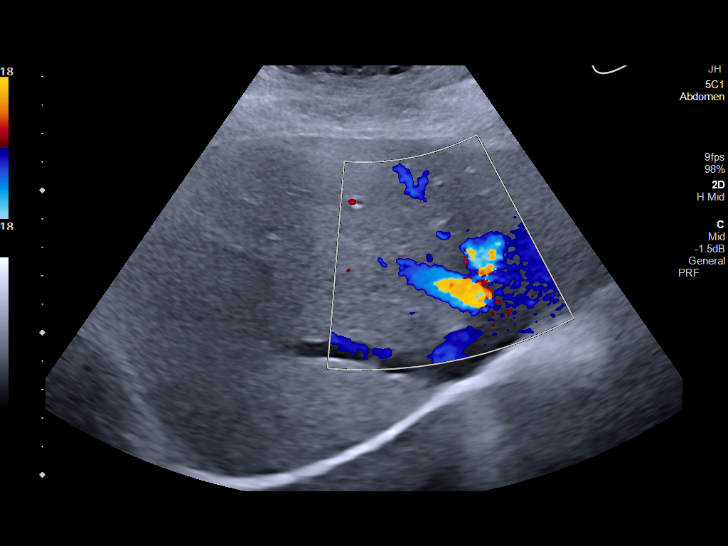
[im 77/84]
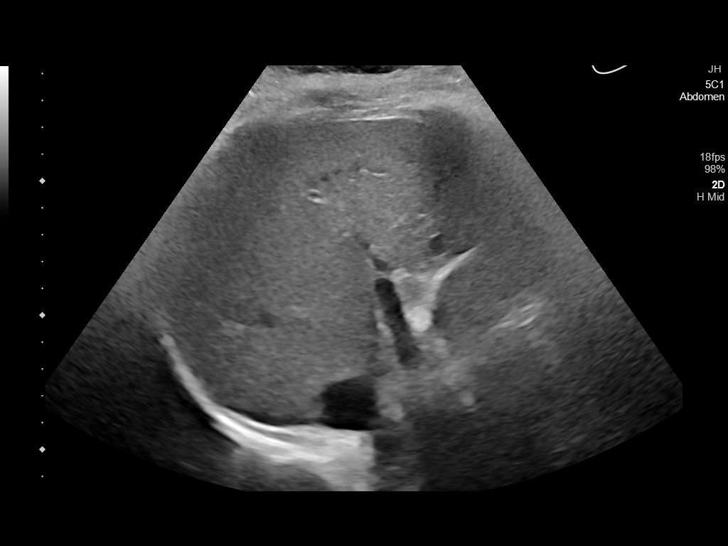
[im 84/84]
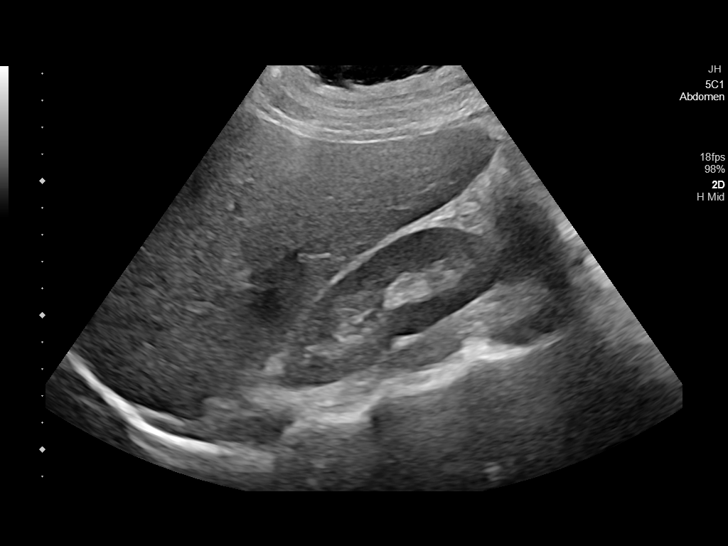

[14 of 25 positions shown; findings below may reference images not displayed]

FINDINGS: Gallbladder:

No gallstones or wall thickening visualized. No sonographic Murphy
sign noted by sonographer.

Common bile duct:

Diameter: 3 mm, normal.

Liver:

No focal lesion identified. Within normal limits in parenchymal
echogenicity. Portal vein is patent on color Doppler imaging with
normal direction of blood flow towards the liver.

Other: Negative visible right kidney.  No free fluid.
IMPRESSION: Normal right upper quadrant ultrasound.

## 2023-04-10 IMAGING — CT CT ABD-PELV W/ CM
2 of 4 series · 17 of 46 positions shown, 19 images · IV contrast (APPLIED)
Comparison: CT abdomen and pelvis 04/06/2020

CLINICAL DATA: Abdominal pain

EXAM:
CT ABDOMEN AND PELVIS WITH CONTRAST
TECHNIQUE: Multidetector CT imaging of the abdomen and pelvis was performed
using the standard protocol following bolus administration of
intravenous contrast.

[Series 2: abdomen 5.0 · axial · 0.87mm/px · z∈[+1135,+1540]mm · 14 of 87 slices shown, 16 images]
[im 6/87  soft-tissue]
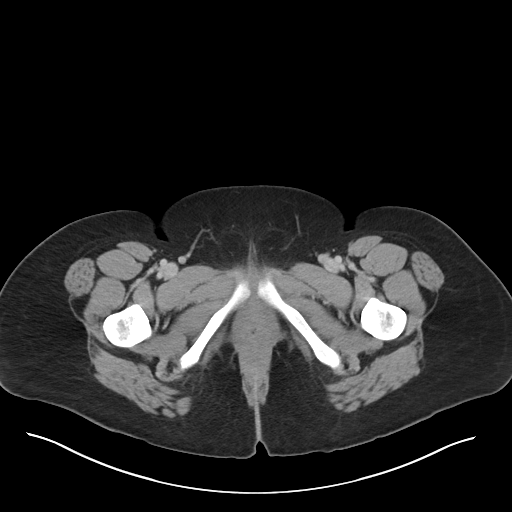
[im 6/87  bone]
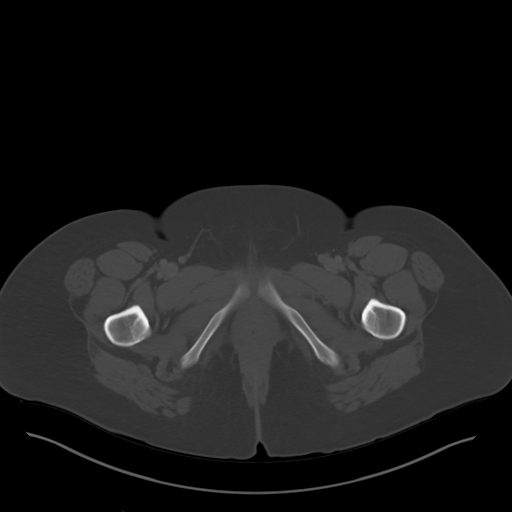
[im 11/87  soft-tissue]
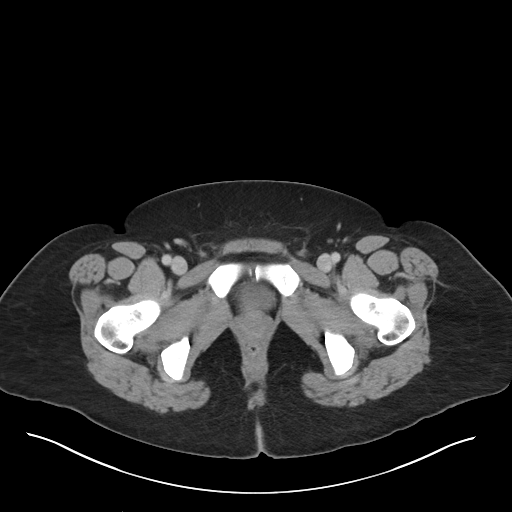
[im 17/87  soft-tissue]
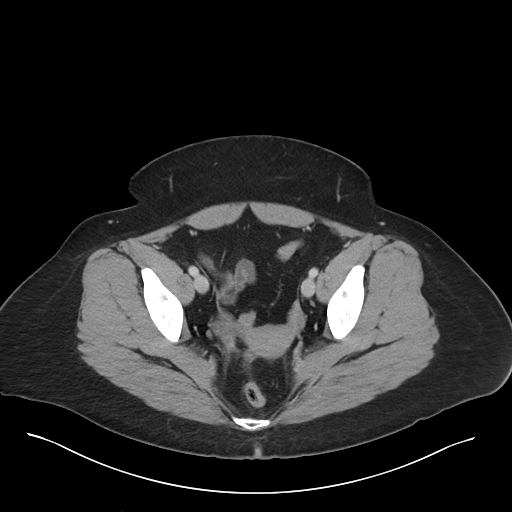
[im 22/87  soft-tissue]
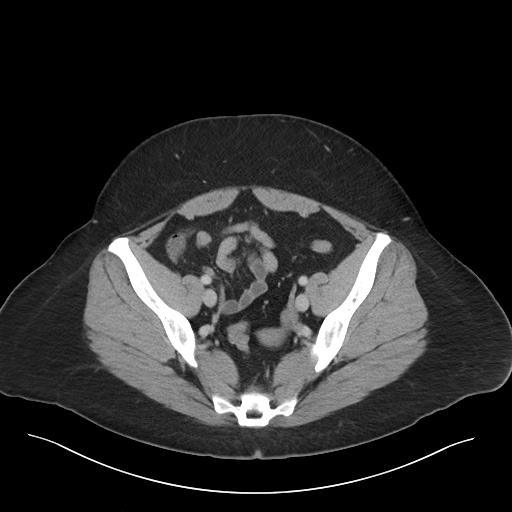
[im 27/87  soft-tissue]
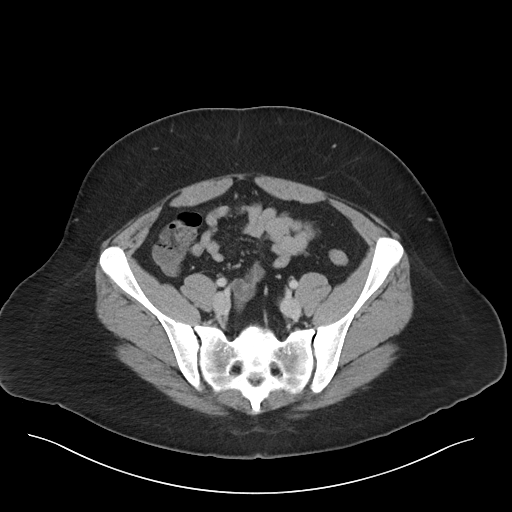
[im 33/87  soft-tissue]
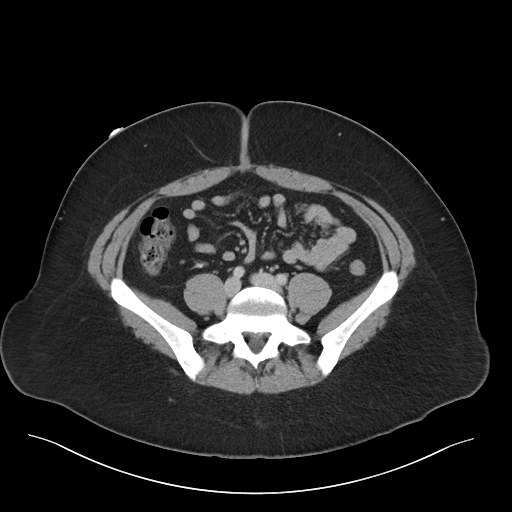
[im 38/87  soft-tissue]
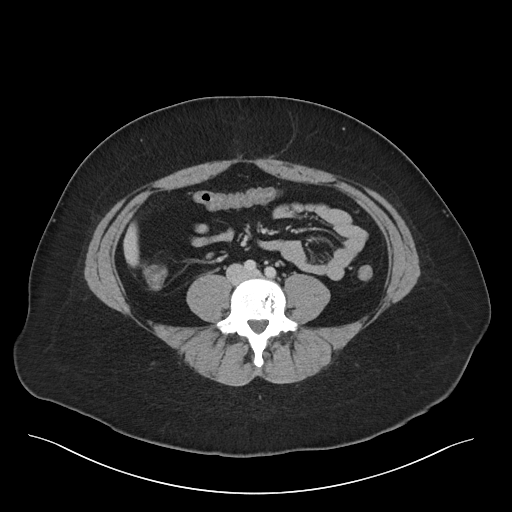
[im 49/87  soft-tissue]
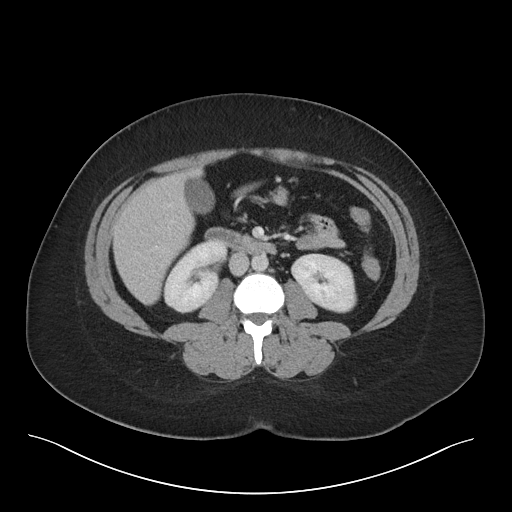
[im 54/87  soft-tissue]
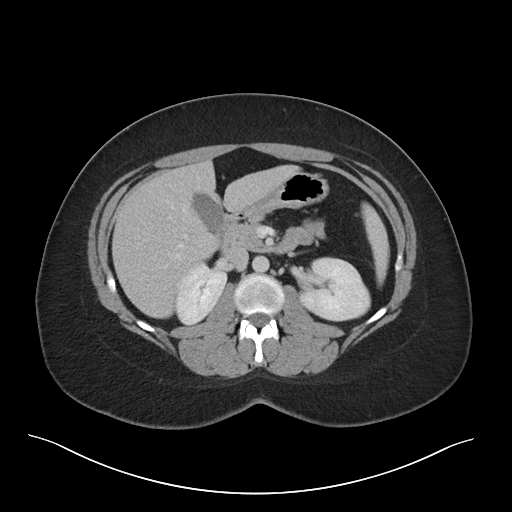
[im 54/87  bone]
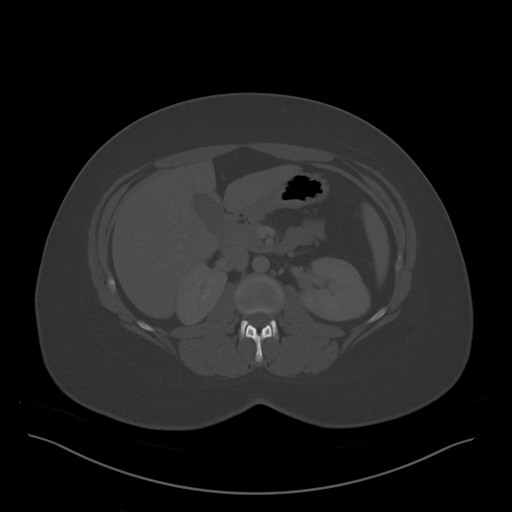
[im 60/87  soft-tissue]
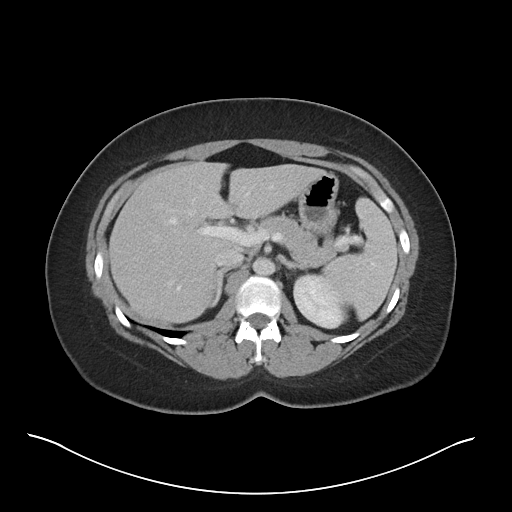
[im 65/87  soft-tissue]
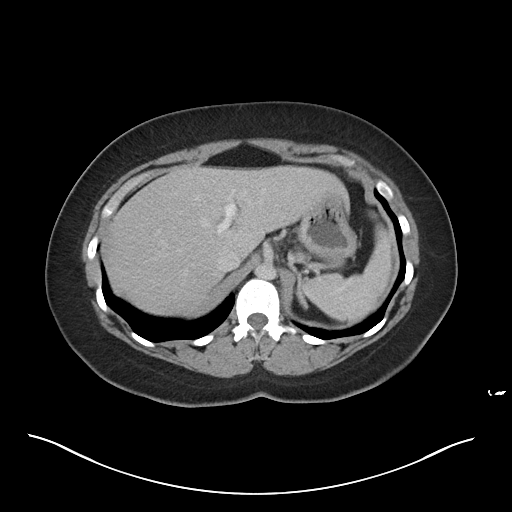
[im 70/87  soft-tissue]
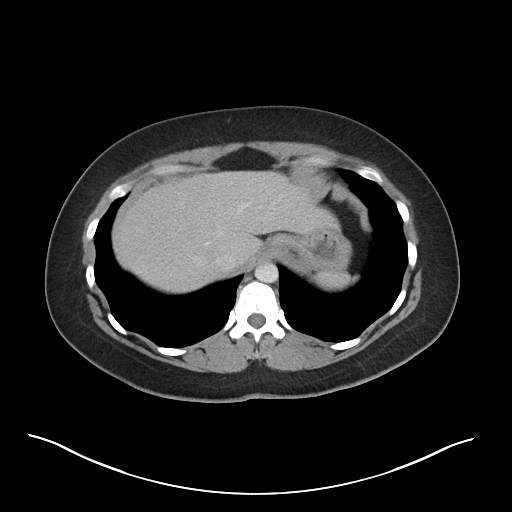
[im 76/87  soft-tissue]
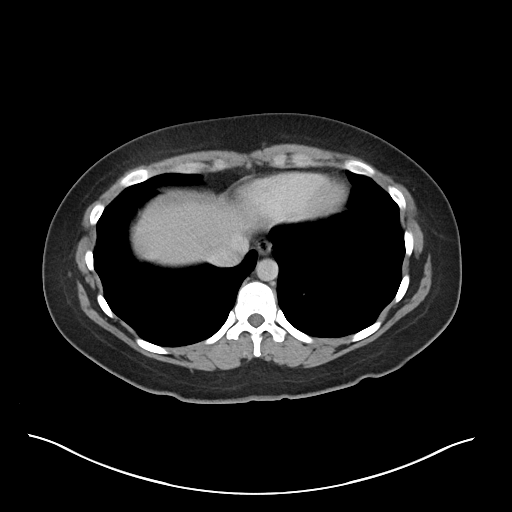
[im 81/87  soft-tissue]
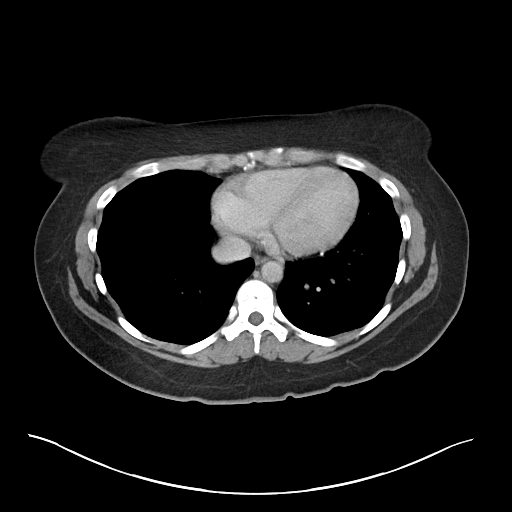

[Series 5: abdomen 3.0 mpr cor · coronal · 0.91mm/px · 3 of 108 slices shown]
[im 36/108  soft-tissue]
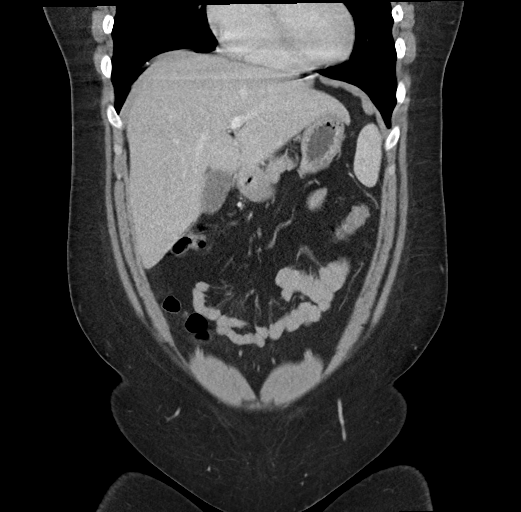
[im 48/108  soft-tissue]
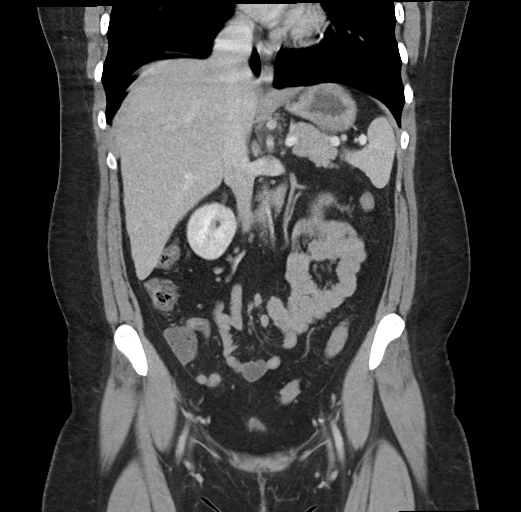
[im 60/108  soft-tissue]
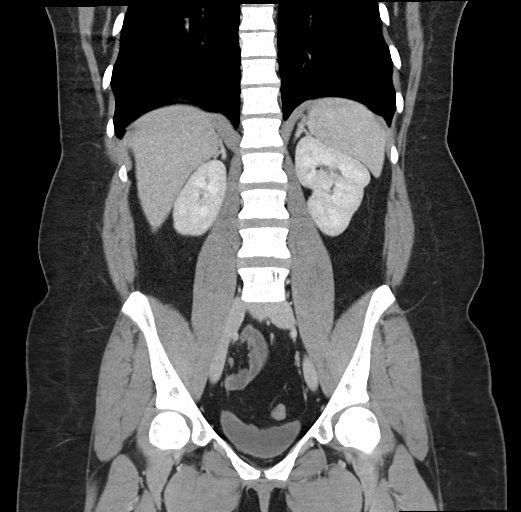

[17 of 46 positions shown; findings below may reference images not displayed]

RADIATION DOSE REDUCTION: This exam was performed according to the
departmental dose-optimization program which includes automated
exposure control, adjustment of the mA and/or kV according to
patient size and/or use of iterative reconstruction technique.

CONTRAST:  100mL OMNIPAQUE IOHEXOL 300 MG/ML  SOLN
FINDINGS: Lower chest: No acute abnormality.

Hepatobiliary: Liver is enlarged measuring 20.4 cm in length. No
hepatic mass identified. Gallbladder appears normal. No biliary
ductal dilatation.

Pancreas: Unremarkable. No pancreatic ductal dilatation or
surrounding inflammatory changes.

Spleen: Normal in size without focal abnormality.

Adrenals/Urinary Tract: Adrenal glands are unremarkable. Kidneys are
normal, without renal calculi, focal lesion, or hydronephrosis.
Bladder is unremarkable.

Stomach/Bowel: Stomach is within normal limits. Appendix appears
normal. No evidence of bowel wall thickening, distention, or
inflammatory changes.

Vascular/Lymphatic: No significant vascular findings are present. No
enlarged abdominal or pelvic lymph nodes.

Reproductive: Uterus and bilateral adnexa are unremarkable.

Other: No abdominal wall hernia or abnormality. No abdominopelvic
ascites.

Musculoskeletal: No acute or significant osseous findings.
IMPRESSION: 1. No acute process identified.
2. Hepatomegaly.

## 2023-08-29 ENCOUNTER — Emergency Department (HOSPITAL_COMMUNITY)
Admission: EM | Admit: 2023-08-29 | Discharge: 2023-08-29 | Discharge status: Against Medical Advice | Payer: Self-pay | Attending: Emergency Medicine | Admitting: Emergency Medicine

## 2023-08-29 ENCOUNTER — Encounter (HOSPITAL_COMMUNITY): Payer: Self-pay

## 2023-08-29 DIAGNOSIS — R109 Unspecified abdominal pain: Secondary | ICD-10-CM | POA: Insufficient documentation

## 2023-08-29 DIAGNOSIS — Z5321 Procedure and treatment not carried out due to patient leaving prior to being seen by health care provider: Secondary | ICD-10-CM | POA: Insufficient documentation

## 2023-08-29 LAB — COMPREHENSIVE METABOLIC PANEL
ALT: 28 U/L (ref 0–44)
AST: 23 U/L (ref 15–41)
Albumin: 4.4 g/dL (ref 3.5–5.0)
Alkaline Phosphatase: 58 U/L (ref 38–126)
Anion gap: 12 (ref 5–15)
BUN: 16 mg/dL (ref 6–20)
CO2: 20 mmol/L — ABNORMAL LOW (ref 22–32)
Calcium: 9.7 mg/dL (ref 8.9–10.3)
Chloride: 100 mmol/L (ref 98–111)
Creatinine, Ser: 0.71 mg/dL (ref 0.44–1.00)
GFR, Estimated: >60 mL/min (ref >60)
Glucose, Bld: 127 mg/dL — ABNORMAL HIGH (ref 70–99)
Potassium: 3.2 mmol/L — ABNORMAL LOW (ref 3.5–5.1)
Sodium: 132 mmol/L — ABNORMAL LOW (ref 135–145)
Total Bilirubin: 0.8 mg/dL (ref 0.0–1.2)
Total Protein: 8.5 g/dL — ABNORMAL HIGH (ref 6.5–8.1)

## 2023-08-29 LAB — CBC
HCT: 44.1 % (ref 36.0–46.0)
Hemoglobin: 14.8 g/dL (ref 12.0–15.0)
MCH: 28.5 pg (ref 26.0–34.0)
MCHC: 33.6 g/dL (ref 30.0–36.0)
MCV: 85 fL (ref 80.0–100.0)
Platelets: 524 10*3/uL — ABNORMAL HIGH (ref 150–400)
RBC: 5.19 MIL/uL — ABNORMAL HIGH (ref 3.87–5.11)
RDW: 12.2 % (ref 11.5–15.5)
WBC: 14.1 10*3/uL — ABNORMAL HIGH (ref 4.0–10.5)
nRBC: 0 % (ref 0.0–0.2)

## 2023-08-29 LAB — URINALYSIS, ROUTINE W REFLEX MICROSCOPIC
Bacteria, UA: NONE SEEN
Bilirubin Urine: NEGATIVE
Glucose, UA: NEGATIVE mg/dL
Hgb urine dipstick: NEGATIVE
Ketones, ur: 20 mg/dL — AB
Leukocytes,Ua: NEGATIVE
Nitrite: NEGATIVE
Protein, ur: 30 mg/dL — AB
Specific Gravity, Urine: 1.028 (ref 1.005–1.030)
pH: 5 (ref 5.0–8.0)

## 2023-08-29 LAB — HCG, SERUM, QUALITATIVE: Preg, Serum: NEGATIVE

## 2023-08-29 LAB — LIPASE, BLOOD: Lipase: 32 U/L (ref 11–51)

## 2023-08-29 NOTE — ED Triage Notes (Signed)
 Pt presents with c/o abdominal pain. Pt reports that he started having abdominal pain several days ago with associated N/V/D.

## 2023-10-15 ENCOUNTER — Other Ambulatory Visit: Payer: Self-pay | Admitting: Nurse Practitioner

## 2023-10-15 DIAGNOSIS — F3341 Major depressive disorder, recurrent, in partial remission: Secondary | ICD-10-CM

## 2023-10-16 NOTE — Telephone Encounter (Signed)
 Requested medications are due for refill today.  yes  Requested medications are on the active medications list.  yes  Last refill. 08/18/2022 #90 3 rf  Future visit scheduled.   no  Notes to clinic.  Pt is more than 3 months overdue for an OV.    Requested Prescriptions  Pending Prescriptions Disp Refills   escitalopram (LEXAPRO) 20 MG tablet [Pharmacy Med Name: ESCITALOPRAM 20 MG TABLET] 90 tablet 3    Sig: TAKE 1 TABLET BY MOUTH DAILY     Psychiatry:  Antidepressants - SSRI Failed - 10/16/2023  3:19 PM      Failed - Completed PHQ-2 or PHQ-9 in the last 360 days      Failed - Valid encounter within last 6 months    Recent Outpatient Visits           1 year ago Recurrent major depressive disorder, in partial remission [facility])   [place] [facility] Della Goo F, FNP   2 years ago Recurrent major depressive disorder, in partial remission [facility])   [place] [facility] Della Goo F, FNP   3 years ago Gastroesophageal reflux disease without esophagitis   [place] [facility] Jamelle Haring, MD   3 years ago Encounter for examination following treatment at hospital   [facility] Welford Roche D, MD   3 years ago Recurrent major depressive disorder, in partial remission [facility])   [facility] Jamelle Haring, MD

## 2023-11-04 ENCOUNTER — Other Ambulatory Visit: Payer: Self-pay | Admitting: Nurse Practitioner

## 2023-11-04 DIAGNOSIS — F3341 Major depressive disorder, recurrent, in partial remission: Secondary | ICD-10-CM

## 2023-11-06 NOTE — Telephone Encounter (Signed)
 Requested medication (s) are due for refill today: Yes  Requested medication (s) are on the active medication list: Yes  Last refill:  08/18/22  Future visit scheduled: No  Notes to clinic:  Unable to reach pt. To make appointment.    Requested Prescriptions  Pending Prescriptions Disp Refills   escitalopram (LEXAPRO) 20 MG tablet [Pharmacy Med Name: ESCITALOPRAM 20 MG TABLET] 90 tablet 3    Sig: TAKE 1 TABLET BY MOUTH DAILY     Psychiatry:  Antidepressants - SSRI Failed - 11/06/2023 12:48 PM      Failed - Completed PHQ-2 or PHQ-9 in the last 360 days      Failed - Valid encounter within last 6 months    Recent Outpatient Visits           1 year ago Recurrent major depressive disorder, in partial remission [facility])   [place] [facility] Della Goo F, FNP   2 years ago Recurrent major depressive disorder, in partial remission [facility])   [place] [facility] Della Goo F, FNP   3 years ago Gastroesophageal reflux disease without esophagitis   [place] [facility] Jamelle Haring, MD   3 years ago Encounter for examination following treatment at hospital   [facility] Welford Roche D, MD   3 years ago Recurrent major depressive disorder, in partial remission [facility])   [facility] Jamelle Haring, MD

## 2023-11-07 ENCOUNTER — Ambulatory Visit: Payer: Self-pay | Admitting: Nurse Practitioner

## 2023-11-07 ENCOUNTER — Other Ambulatory Visit: Payer: Self-pay | Admitting: Nurse Practitioner

## 2023-11-07 DIAGNOSIS — F3341 Major depressive disorder, recurrent, in partial remission: Secondary | ICD-10-CM

## 2023-11-07 NOTE — Telephone Encounter (Signed)
 Copied from CRM [id number]. Topic: Clinical - Red Word Triage >> Nov 07, 2023  5:05 PM Shon Hale wrote: Red Word that prompted transfer to Nurse Triage: Out of depression medication (lexapro), depression is getting worse.   Chief Complaint: depression Symptoms: irritability, mood swings, sadness, afraid SI thoughts will return  Frequency: Saturday been out of lexapro Pertinent Negatives: Patient denies SI thoughts present  Disposition: [] ED /[] Urgent Care (no appt availability in office) / [x] Appointment(In office/virtual)/ []  [facility]/ [] Home Care/ [] Refused Recommended Disposition /[] [facility]/ []  Follow-up with PCP Additional Notes: pt states she been out of Lexapro and starting to have depression sx again. Pt does take OTC supplements that help with mood but wanting refill on medication. LOV was 2023, advised pt she needs to be seen, scheduled OV tomorrow with Sheliah Mends, PA at 1540, was unable to schedule with PCP d/t PCP didn't have timeframe that worked for pt's schedule tomorrow or Thursday.   Answer Assessment - Initial Assessment Questions 1. CONCERN: "What happened that made you call today?"     Out of med  2. DEPRESSION SYMPTOM SCREENING: "How are you feeling overall?" (e.g., decreased energy, increased sleeping or difficulty sleeping, difficulty concentrating, feelings of sadness, guilt, hopelessness, or worthlessness)     Irrititable, mood swings, sadness and crying  3. RISK OF HARM - SUICIDAL IDEATION:  "Do you ever have thoughts of hurting or killing yourself?"  (e.g., yes, no, no but preoccupation with thoughts about death)   - INTENT:  "Do you have thoughts of hurting or killing yourself right NOW?" (e.g., yes, no, N/A)   - PLAN: "Do you have a specific plan for how you would do this?" (e.g., gun, knife, overdose, no plan, N/A)     No but afraid that SI will come back without being off meds  4. RISK OF HARM - HOMICIDAL IDEATION:  "Do you ever have thoughts of  hurting or killing someone else?"  (e.g., yes, no, no but preoccupation with thoughts about death)   - INTENT:  "Do you have thoughts of hurting or killing someone right NOW?" (e.g., yes, no, N/A)   - PLAN: "Do you have a specific plan for how you would do this?" (e.g., gun, knife, no plan, N/A)      no 5. FUNCTIONAL IMPAIRMENT: "How have things been going for you overall? Have you had more difficulty than usual doing your normal daily activities?"  (e.g., better, same, worse; self-care, school, work, interactions)     yes 11. PREGNANCY: "Is there any chance you are pregnant?" "When was your last menstrual period?"       no  Protocols used: Depression-A-AH  Reason for Disposition  [1] Depression AND [2] worsening (e.g., sleeping poorly, less able to do activities of daily living)  Answer Assessment - Initial Assessment Questions 1. CONCERN: "What happened that made you call today?"     Out of med  2. DEPRESSION SYMPTOM SCREENING: "How are you feeling overall?" (e.g., decreased energy, increased sleeping or difficulty sleeping, difficulty concentrating, feelings of sadness, guilt, hopelessness, or worthlessness)     Irrititable, mood swings, sadness and crying  3. RISK OF HARM - SUICIDAL IDEATION:  "Do you ever have thoughts of hurting or killing yourself?"  (e.g., yes, no, no but preoccupation with thoughts about death)   - INTENT:  "Do you have thoughts of hurting or killing yourself right NOW?" (e.g., yes, no, N/A)   - PLAN: "Do you have a specific plan for how  you would do this?" (e.g., gun, knife, overdose, no plan, N/A)     No but afraid that SI will come back without being off meds  4. RISK OF HARM - HOMICIDAL IDEATION:  "Do you ever have thoughts of hurting or killing someone else?"  (e.g., yes, no, no but preoccupation with thoughts about death)   - INTENT:  "Do you have thoughts of hurting or killing someone right NOW?" (e.g., yes, no, N/A)   - PLAN: "Do you have a specific plan for  how you would do this?" (e.g., gun, knife, no plan, N/A)      no 5. FUNCTIONAL IMPAIRMENT: "How have things been going for you overall? Have you had more difficulty than usual doing your normal daily activities?"  (e.g., better, same, worse; self-care, school, work, interactions)     yes 11. PREGNANCY: "Is there any chance you are pregnant?" "When was your last menstrual period?"       no  Protocols used: Depression-A-AH

## 2023-11-08 ENCOUNTER — Encounter: Payer: Self-pay | Admitting: Family Medicine

## 2023-11-08 ENCOUNTER — Ambulatory Visit: Payer: Self-pay | Admitting: Family Medicine

## 2023-11-08 VITALS — BP 118/68 | HR 92 | Resp 16 | Ht 65.0 in | Wt 210.0 lb

## 2023-11-08 DIAGNOSIS — F410 Panic disorder [episodic paroxysmal anxiety] without agoraphobia: Secondary | ICD-10-CM

## 2023-11-08 DIAGNOSIS — F3341 Major depressive disorder, recurrent, in partial remission: Secondary | ICD-10-CM | POA: Diagnosis not present

## 2023-11-08 DIAGNOSIS — F411 Generalized anxiety disorder: Secondary | ICD-10-CM

## 2023-11-08 MED ORDER — ESCITALOPRAM OXALATE 20 MG PO TABS
ORAL_TABLET | ORAL | 1 refills | Status: DC
Start: 2023-11-08 — End: 2024-02-26

## 2023-11-08 MED ORDER — HYDROXYZINE HCL 10 MG PO TABS
10.0000 mg | ORAL_TABLET | Freq: Three times a day (TID) | ORAL | 1 refills | Status: DC | PRN
Start: 2023-11-08 — End: 2024-02-26

## 2023-11-08 NOTE — Progress Notes (Signed)
 Name: Yolanda Gardner   MRN: [id number]    DOB: December 02, 1995   Date:11/08/2023       Progress Note  Chief Complaint  Patient presents with   Medication Refill   Depression     Subjective:   Yolanda Gardner is a 28 y.o. female, presents to clinic for routine follow up on chronic conditions  Here for med refills, she has been out for 5 days and has been feeling some withdrawal, but usually meds work well for her, she has been on lexapro since she was 77 She has not been able to do a regular f/up appt over the past year + due to loss of insurance She has moved out and is in a better place than last OV with her PCP    11/08/2023    3:47 PM 08/18/2022    1:42 PM 06/03/2021    8:20 AM  Depression screen PHQ 2/9  Decreased Interest 1 0 2  Down, Depressed, Hopeless 2 1 2   PHQ - 2 Score 3 1 4   Altered sleeping 3 3 3   Tired, decreased energy 2 3 3   Change in appetite 1 1 1   Feeling bad or failure about yourself  0 1 2  Trouble concentrating 0 0 1  Moving slowly or fidgety/restless 0 0 0  Suicidal thoughts 0 0 0  PHQ-9 Score 9 9 14   Difficult doing work/chores  Somewhat difficult Somewhat difficult      11/08/2023    3:48 PM 08/18/2022    1:44 PM 06/03/2021    8:21 AM 01/01/2020    1:33 PM  GAD 7 : Generalized Anxiety Score  Nervous, Anxious, on Edge 2 1 2 3   Control/stop worrying 2 0 2 1  Worry too much - different things 1 1 2 1   Trouble relaxing 0 0 1 0  Restless 1 0 0 1  Easily annoyed or irritable 2 1 3 1   Afraid - awful might happen 1 0 1 0  Total GAD 7 Score 9 3 11 7   Anxiety Difficulty  Not difficult at all Somewhat difficult Somewhat difficult   She uses hydroxyzine sometimes for sleep, it is very sedating for her     Current Outpatient Medications:    escitalopram (LEXAPRO) 20 MG tablet, TAKE 1 TABLET(20 MG) BY MOUTH DAILY, Disp: 90 tablet, Rfl: 3   famotidine (PEPCID) 20 MG tablet, TAKE 1 TABLET(20 MG) BY MOUTH TWICE DAILY, Disp: 60 tablet, Rfl: 3    hydrOXYzine (ATARAX) 10 MG tablet, Take 1 tablet (10 mg total) by mouth 3 (three) times daily as needed., Disp: 90 tablet, Rfl: 3   promethazine (PHENERGAN) 25 MG suppository, Place 1 suppository (25 mg total) rectally every 6 (six) hours as needed for nausea or vomiting. (Patient not taking: Reported on 11/08/2023), Disp: 12 each, Rfl: 0  Patient Active Problem List   Diagnosis Date Noted   Gastroesophageal reflux disease without esophagitis 04/22/2020   Generalized anxiety disorder with panic attacks 01/01/2020   Class 1 obesity due to excess calories without serious comorbidity with body mass index (BMI) of 30.0 to 30.9 in adult 03/29/2018   Vitamin D deficiency 05/25/2015   Depression, major, recurrent (HCC) 04/24/2015    Past Surgical History:  Procedure Laterality Date   WISDOM TOOTH EXTRACTION      Family History  Problem Relation Age of Onset   Depression Mother    Hypertension Maternal Grandmother    Diabetes Maternal Aunt    Stomach cancer  Neg Hx    Colon cancer Neg Hx    Esophageal cancer Neg Hx    Pancreatic cancer Neg Hx     Social History   Tobacco Use   Smoking status: Never   Smokeless tobacco: Never  Vaping Use   Vaping status: Never Used  Substance Use Topics   Alcohol use: Yes    Comment: Social   Drug use: Yes    Types: Marijuana    Comment: 1 x a week     No Known Allergies  Health Maintenance  Topic Date Due   Cervical Cancer Screening (Pap smear)  03/29/2021   INFLUENZA VACCINE  03/30/2023   COVID-19 Vaccine (4 - 2024-25 season) 04/30/2023   DTaP/Tdap/Td (9 - Td or Tdap) 10/19/2029   HPV VACCINES  Completed   Hepatitis C Screening  Completed   HIV Screening  Completed    Chart Review Today: I personally reviewed active problem list, medication list, allergies, family history, social history, health maintenance, notes from last encounter, lab results, imaging with the patient/caregiver today.   Review of Systems  Constitutional:  Negative.   HENT: Negative.    Eyes: Negative.   Respiratory: Negative.    Cardiovascular: Negative.   Gastrointestinal: Negative.   Endocrine: Negative.   Genitourinary: Negative.   Musculoskeletal: Negative.   Skin: Negative.   Allergic/Immunologic: Negative.   Neurological: Negative.   Hematological: Negative.   Psychiatric/Behavioral: Negative.    All other systems reviewed and are negative.    Objective:   Vitals:   11/08/23 1546  BP: 118/68  Pulse: 92  Resp: 16  SpO2: 99%  Weight: 210 lb (95.3 kg)  Height: 5\' 5"  (1.651 m)    Body mass index is 34.95 kg/m.  Physical Exam Vitals and nursing note reviewed.  Constitutional:      Appearance: She is well-developed. She is obese.  HENT:     Head: Normocephalic and atraumatic.     Nose: Nose normal.  Eyes:     General:        Right eye: No discharge.        Left eye: No discharge.     Conjunctiva/sclera: Conjunctivae normal.  Neck:     Trachea: No tracheal deviation.  Cardiovascular:     Rate and Rhythm: Normal rate.  Pulmonary:     Effort: Pulmonary effort is normal. No respiratory distress.     Breath sounds: No stridor.  Skin:    General: Skin is warm and dry.     Findings: No rash.  Neurological:     Mental Status: She is alert.     Motor: No abnormal muscle tone.     Coordination: Coordination normal.  Psychiatric:        Mood and Affect: Mood normal.        Behavior: Behavior normal.      Functional Status Survey:   Results for orders placed or performed during the hospital encounter of 08/29/23  Lipase, blood   Collection Time: 08/29/23 10:20 AM  Result Value Ref Range   Lipase 32 11 - 51 U/L  Comprehensive metabolic panel   Collection Time: 08/29/23 10:20 AM  Result Value Ref Range   Sodium 132 (L) 135 - 145 mmol/L   Potassium 3.2 (L) 3.5 - 5.1 mmol/L   Chloride 100 98 - 111 mmol/L   CO2 20 (L) 22 - 32 mmol/L   Glucose, Bld 127 (H) 70 - 99 mg/dL   BUN 16 6 - 20 mg/dL  Creatinine,  Ser 0.71 0.44 - 1.00 mg/dL   Calcium 9.7 8.9 - 16.1 mg/dL   Total Protein 8.5 (H) 6.5 - 8.1 g/dL   Albumin 4.4 3.5 - 5.0 g/dL   AST 23 15 - 41 U/L   ALT 28 0 - 44 U/L   Alkaline Phosphatase 58 38 - 126 U/L   Total Bilirubin 0.8 0.0 - 1.2 mg/dL   GFR, Estimated >09 >60 mL/min   Anion gap 12 5 - 15  CBC   Collection Time: 08/29/23 10:20 AM  Result Value Ref Range   WBC 14.1 (H) 4.0 - 10.5 K/uL   RBC 5.19 (H) 3.87 - 5.11 MIL/uL   Hemoglobin 14.8 12.0 - 15.0 g/dL   HCT 45.4 09.8 - 11.9 %   MCV 85.0 80.0 - 100.0 fL   MCH 28.5 26.0 - 34.0 pg   MCHC 33.6 30.0 - 36.0 g/dL   RDW 14.7 82.9 - 56.2 %   Platelets 524 (H) 150 - 400 K/uL   nRBC 0.0 0.0 - 0.2 %  hCG, serum, qualitative   Collection Time: 08/29/23 10:20 AM  Result Value Ref Range   Preg, Serum NEGATIVE NEGATIVE  Urinalysis, Routine w reflex microscopic -Urine, Clean Catch   Collection Time: 08/29/23 10:46 AM  Result Value Ref Range   Color, Urine YELLOW YELLOW   APPearance HAZY (A) CLEAR   Specific Gravity, Urine 1.028 1.005 - 1.030   pH 5.0 5.0 - 8.0   Glucose, UA NEGATIVE NEGATIVE mg/dL   Hgb urine dipstick NEGATIVE NEGATIVE   Bilirubin Urine NEGATIVE NEGATIVE   Ketones, ur 20 (A) NEGATIVE mg/dL   Protein, ur 30 (A) NEGATIVE mg/dL   Nitrite NEGATIVE NEGATIVE   Leukocytes,Ua NEGATIVE NEGATIVE   RBC / HPF 0-5 0 - 5 RBC/hpf   WBC, UA 6-10 0 - 5 WBC/hpf   Bacteria, UA NONE SEEN NONE SEEN   Squamous Epithelial / HPF 11-20 0 - 5 /HPF   Mucus PRESENT       Assessment & Plan:     ICD-10-CM   1. Recurrent major depressive disorder, in partial remission (HCC)  F33.41 escitalopram (LEXAPRO) 20 MG tablet    hydrOXYzine (ATARAX) 10 MG tablet   score positive but similar to past PHQ 9's, she reports shes actually having better mood on current meds she just ran out, refills ordered, f/up pcp    2. Generalized anxiety disorder with panic attacks  F41.1    F41.0    refilled lexapro and hydroxyzine, GAD 7 score positive,  she reports mostly due to being out of meds x 5 d, meds refilled, f/up PCP w/in 11m        Return in about 6 months (around 05/10/2024) for Annual Physical and med refill Raynelle Fanning .   Danelle Berry, PA-C 11/08/23 3:53 PM

## 2023-11-08 NOTE — Telephone Encounter (Signed)
 Requested medication (s) are due for refill today: yes  Requested medication (s) are on the active medication list: yes  Last refill:  08/18/22  Future visit scheduled: yes today  Notes to clinic:  pt due for appt, scheduled with Sheliah Mends, PA today. Pt been out of meds x 5 days      Requested Prescriptions  Pending Prescriptions Disp Refills   escitalopram (LEXAPRO) 20 MG tablet [Pharmacy Med Name: ESCITALOPRAM 20 MG TABLET] 90 tablet 3    Sig: TAKE 1 TABLET BY MOUTH DAILY     Psychiatry:  Antidepressants - SSRI Failed - 11/08/2023 12:45 PM      Failed - Completed PHQ-2 or PHQ-9 in the last 360 days      Failed - Valid encounter within last 6 months    Recent Outpatient Visits           1 year ago Recurrent major depressive disorder, in partial remission [facility])   [place] [facility] Della Goo F, FNP   2 years ago Recurrent major depressive disorder, in partial remission [facility])   [place] [facility] Della Goo F, FNP   3 years ago Gastroesophageal reflux disease without esophagitis   [place] [facility] Jamelle Haring, MD   3 years ago Encounter for examination following treatment at hospital   [facility] Welford Roche D, MD   3 years ago Recurrent major depressive disorder, in partial remission [facility])   [facility] Jamelle Haring, MD

## 2023-11-08 NOTE — Telephone Encounter (Signed)
 Has appt today

## 2024-02-26 ENCOUNTER — Encounter: Payer: Self-pay | Admitting: Nurse Practitioner

## 2024-02-26 ENCOUNTER — Ambulatory Visit: Admitting: Nurse Practitioner

## 2024-02-26 VITALS — BP 110/72 | HR 92 | Resp 16 | Ht 65.0 in | Wt 232.6 lb

## 2024-02-26 DIAGNOSIS — E66811 Obesity, class 1: Secondary | ICD-10-CM | POA: Diagnosis not present

## 2024-02-26 DIAGNOSIS — E782 Mixed hyperlipidemia: Secondary | ICD-10-CM | POA: Insufficient documentation

## 2024-02-26 DIAGNOSIS — F3341 Major depressive disorder, recurrent, in partial remission: Secondary | ICD-10-CM

## 2024-02-26 DIAGNOSIS — Z6830 Body mass index (BMI) 30.0-30.9, adult: Secondary | ICD-10-CM

## 2024-02-26 DIAGNOSIS — E6609 Other obesity due to excess calories: Secondary | ICD-10-CM

## 2024-02-26 DIAGNOSIS — F410 Panic disorder [episodic paroxysmal anxiety] without agoraphobia: Secondary | ICD-10-CM

## 2024-02-26 DIAGNOSIS — M25561 Pain in right knee: Secondary | ICD-10-CM

## 2024-02-26 DIAGNOSIS — K219 Gastro-esophageal reflux disease without esophagitis: Secondary | ICD-10-CM

## 2024-02-26 DIAGNOSIS — F411 Generalized anxiety disorder: Secondary | ICD-10-CM

## 2024-02-26 MED ORDER — HYDROXYZINE HCL 10 MG PO TABS
10.0000 mg | ORAL_TABLET | Freq: Three times a day (TID) | ORAL | 1 refills | Status: AC | PRN
Start: 2024-02-26 — End: ?

## 2024-02-26 MED ORDER — CELECOXIB 100 MG PO CAPS
100.0000 mg | ORAL_CAPSULE | Freq: Two times a day (BID) | ORAL | 0 refills | Status: DC
Start: 2024-02-26 — End: 2024-03-25

## 2024-02-26 MED ORDER — ESCITALOPRAM OXALATE 20 MG PO TABS
ORAL_TABLET | ORAL | 1 refills | Status: AC
Start: 2024-02-26 — End: ?
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
## 2024-03-22 ENCOUNTER — Other Ambulatory Visit: Payer: Self-pay | Admitting: Nurse Practitioner

## 2024-03-22 DIAGNOSIS — M25561 Pain in right knee: Secondary | ICD-10-CM

## 2024-03-25 NOTE — Telephone Encounter (Signed)
 Requested Prescriptions  Pending Prescriptions Disp Refills   celecoxib  (CELEBREX ) 100 MG capsule [Pharmacy Med Name: CELECOXIB  100 MG CAPSULE] 20 capsule 0    Sig: TAKE 1 CAPSULE BY MOUTH 2 TIMES A DAY     Analgesics:  COX2 Inhibitors Failed - 03/25/2024  7:43 AM      Failed - Manual Review: Labs are only required if the patient has taken medication for more than 8 weeks.      Passed - HGB in normal range and within 360 days    Hemoglobin  Date Value Ref Range Status  08/29/2023 14.8 12.0 - 15.0 g/dL Final  91/73/7983 87.6 11.1 - 15.9 g/dL Final         Passed - Cr in normal range and within 360 days    Creat  Date Value Ref Range Status  08/18/2022 0.68 0.50 - 0.96 mg/dL Final   Creatinine, Ser  Date Value Ref Range Status  08/29/2023 0.71 0.44 - 1.00 mg/dL Final         Passed - HCT in normal range and within 360 days    HCT  Date Value Ref Range Status  08/29/2023 44.1 36.0 - 46.0 % Final   Hematocrit  Date Value Ref Range Status  04/24/2015 38.7 34.0 - 46.6 % Final         Passed - AST in normal range and within 360 days    AST  Date Value Ref Range Status  08/29/2023 23 15 - 41 U/L Final         Passed - ALT in normal range and within 360 days    ALT  Date Value Ref Range Status  08/29/2023 28 0 - 44 U/L Final         Passed - eGFR is 30 or above and within 360 days    GFR, Est African American  Date Value Ref Range Status  03/29/2018 126 > OR = 60 mL/min/1.16m2 Final   GFR calc Af Amer  Date Value Ref Range Status  04/05/2020 >60 >60 mL/min Final   GFR, Est Non African American  Date Value Ref Range Status  03/29/2018 109 > OR = 60 mL/min/1.26m2 Final   GFR, Estimated  Date Value Ref Range Status  08/29/2023 >60 >60 mL/min Final    Comment:    (NOTE) Calculated using the CKD-EPI Creatinine Equation (2021)    GFR  Date Value Ref Range Status  02/07/2020 93.69 >60.00 mL/min Final   eGFR  Date Value Ref Range Status  08/18/2022 123 > OR =  60 mL/min/1.25m2 Final         Passed - Patient is not pregnant      Passed - Valid encounter within last 12 months    Recent Outpatient Visits           4 weeks ago Gastroesophageal reflux disease without esophagitis   [facility] Gareth Mliss FALCON, FNP   4 months ago Recurrent major depressive disorder, in partial remission [facility])   [place] [facility] Leavy Mole, PA-C       Future Appointments             In 1 month Gareth, Mliss FALCON, FNP [facility], [facility]

## 2024-05-13 ENCOUNTER — Ambulatory Visit (INDEPENDENT_AMBULATORY_CARE_PROVIDER_SITE_OTHER): Admitting: Nurse Practitioner

## 2024-05-13 ENCOUNTER — Encounter: Payer: Self-pay | Admitting: Nurse Practitioner

## 2024-05-13 ENCOUNTER — Other Ambulatory Visit (HOSPITAL_COMMUNITY)
Admission: RE | Admit: 2024-05-13 | Discharge: 2024-05-13 | Disposition: A | Discharge status: Home / Self Care | Source: Ambulatory Visit | Attending: Nurse Practitioner | Admitting: Nurse Practitioner

## 2024-05-13 VITALS — BP 124/82 | HR 87 | Resp 18 | Ht 65.0 in | Wt 238.8 lb

## 2024-05-13 DIAGNOSIS — Z13220 Encounter for screening for lipoid disorders: Secondary | ICD-10-CM

## 2024-05-13 DIAGNOSIS — Z130 Encounter for screening for diseases of the blood and blood-forming organs and certain disorders involving the immune mechanism: Secondary | ICD-10-CM

## 2024-05-13 DIAGNOSIS — Z131 Encounter for screening for diabetes mellitus: Secondary | ICD-10-CM | POA: Diagnosis not present

## 2024-05-13 DIAGNOSIS — Z124 Encounter for screening for malignant neoplasm of cervix: Secondary | ICD-10-CM

## 2024-05-13 DIAGNOSIS — E66811 Obesity, class 1: Secondary | ICD-10-CM

## 2024-05-13 DIAGNOSIS — Z6830 Body mass index (BMI) 30.0-30.9, adult: Secondary | ICD-10-CM

## 2024-05-13 DIAGNOSIS — E6609 Other obesity due to excess calories: Secondary | ICD-10-CM

## 2024-05-13 DIAGNOSIS — Z0000 Encounter for general adult medical examination without abnormal findings: Secondary | ICD-10-CM

## 2024-05-13 DIAGNOSIS — N6322 Unspecified lump in the left breast, upper inner quadrant: Secondary | ICD-10-CM

## 2024-05-13 NOTE — Progress Notes (Signed)
 Name: Yolanda Gardner   MRN: [id number]    DOB: 01-05-1996   Date:05/13/2024       Progress Note  Subjective  Chief Complaint  Chief Complaint  Patient presents with   Annual Exam    HPI  Patient presents for annual CPE.  Diet: Regular, well-balanced diet Exercise: 3-4 times per week Sleep: 6 to 8 hours of sleep per night Last dental exam: One year ago Last eye exam: One year ago, no problems with vision  Flowsheet Row Office Visit from 08/18/2022 in [facility]  AUDIT-C Score 0   Depression: Phq 9 is  negative    05/13/2024    1:33 PM 02/26/2024    2:19 PM 11/08/2023    3:47 PM 08/18/2022    1:42 PM 06/03/2021    8:20 AM  Depression screen PHQ 2/9  Decreased Interest 0 0 1 0 2  Down, Depressed, Hopeless 0 0 2 1 2   PHQ - 2 Score 0 0 3 1 4   Altered sleeping 0 0 3 3 3   Tired, decreased energy 0 0 2 3 3   Change in appetite 0 0 1 1 1   Feeling bad or failure about yourself  0 0 0 1 2  Trouble concentrating 0 0 0 0 1  Moving slowly or fidgety/restless 0 0 0 0 0  Suicidal thoughts 0 0 0 0 0  PHQ-9 Score 0 0 9 9 14   Difficult doing work/chores Not difficult at all Not difficult at all  Somewhat difficult Somewhat difficult   Hypertension: BP Readings from Last 3 Encounters:  05/13/24 124/82  02/26/24 110/72  11/08/23 118/68   Obesity: Wt Readings from Last 3 Encounters:  05/13/24 238 lb 12.8 oz (108.3 kg)  02/26/24 232 lb 9.6 oz (105.5 kg)  11/08/23 210 lb (95.3 kg)   BMI Readings from Last 3 Encounters:  05/13/24 39.74 kg/m  02/26/24 38.71 kg/m  11/08/23 34.95 kg/m     Vaccines:  HPV: up to at age 50, ask insurance if age between 59-45  Shingrix: 83-64 yo and ask insurance if covered when patient above 12 yo. Does not qualify. Pneumonia: Educated and discussed with patient. Does not qualify. Flu: Educated and discussed with patient. Declined.  Hep C Screening: Done 08/18/2022 STD testing and prevention (HIV/chl/gon/syphilis):  Done 08/18/2022 Intimate partner violence: No Sexual History : Sexually active; Uses condoms for protection. Menstrual History/LMP/Abnormal Bleeding: LMP 04/29/2024; denies any abnormal bleeding. Incontinence Symptoms: No  Breast cancer:  - Last Mammogram: Does not qualify. - BRCA gene screening: None  Osteoporosis: Discussed high calcium and vitamin D  supplementation, weight bearing exercises  Cervical cancer screening: Pap smear completed during this visit (05/13/2024).  Skin cancer: Discussed monitoring for atypical lesions  Colorectal cancer: Does not qualify. Lung cancer:  Low Dose CT Chest recommended if Age 26-80 years, 20 pack-year currently smoking OR have quit w/in 15years. Patient does not qualify.   ECG: 03/13/2023  Advanced Care Planning: A voluntary discussion about advance care planning including the explanation and discussion of advance directives.  Discussed health care proxy and Living will, and the patient was able to identify a health care proxy as mother Isidoro Lunger).  Patient does not have a living will at present time. If patient does have living will, I have requested they bring this to the clinic to be scanned in to their chart.  Lipids: Lab Results  Component Value Date   CHOL 208 (H) 08/18/2022   CHOL 207 (H) 03/29/2018  CHOL 187 (H) 04/24/2015   Lab Results  Component Value Date   HDL 58 08/18/2022   HDL 39 (L) 03/29/2018   HDL 45 04/24/2015   Lab Results  Component Value Date   LDLCALC 132 (H) 08/18/2022   LDLCALC 140 (H) 03/29/2018   LDLCALC 120 (H) 04/24/2015   Lab Results  Component Value Date   TRIG 85 08/18/2022   TRIG 150 (H) 03/29/2018   TRIG 112 (H) 04/24/2015   Lab Results  Component Value Date   CHOLHDL 3.6 08/18/2022   CHOLHDL 5.3 (H) 03/29/2018   No results found for: LDLDIRECT  Glucose: Glucose, Bld  Date Value Ref Range Status  08/29/2023 127 (H) 70 - 99 mg/dL Final    Comment:    Glucose reference range applies  only to samples taken after fasting for at least 8 hours.  03/09/2023 106 (H) 70 - 99 mg/dL Final    Comment:    Glucose reference range applies only to samples taken after fasting for at least 8 hours.  03/08/2023 160 (H) 70 - 99 mg/dL Final    Comment:    Glucose reference range applies only to samples taken after fasting for at least 8 hours.    Patient Active Problem List   Diagnosis Date Noted   Mixed hyperlipidemia 02/26/2024   Gastroesophageal reflux disease without esophagitis 04/22/2020   Generalized anxiety disorder with panic attacks 01/01/2020   Class 1 obesity due to excess calories without serious comorbidity with body mass index (BMI) of 30.0 to 30.9 in adult 03/29/2018   Vitamin D  deficiency 05/25/2015   Depression, major, recurrent (HCC) 04/24/2015    Past Surgical History:  Procedure Laterality Date   WISDOM TOOTH EXTRACTION      Family History  Problem Relation Age of Onset   Depression Mother    Hypertension Maternal Grandmother    Diabetes Maternal Aunt    Stomach cancer Neg Hx    Colon cancer Neg Hx    Esophageal cancer Neg Hx    Pancreatic cancer Neg Hx     Social History   Socioeconomic History   Marital status: Single    Spouse name: Not on file   Number of children: Not on file   Years of education: Not on file   Highest education level: Not on file  Occupational History   Not on file  Tobacco Use   Smoking status: Never   Smokeless tobacco: Never  Vaping Use   Vaping status: Never Used  Substance and Sexual Activity   Alcohol use: Yes    Comment: Social   Drug use: Yes    Types: Marijuana    Comment: 1 x a week   Sexual activity: Not Currently  Other Topics Concern   Not on file  Social History Narrative   Not on file   Social Drivers of Health   Financial Resource Strain: Medium Risk (05/13/2024)   Overall Financial Resource Strain (CARDIA)    Difficulty of Paying Living Expenses: Somewhat hard  Food Insecurity: Food  Insecurity Present (05/13/2024)   Hunger Vital Sign    Worried About Running Out of Food in the Last Year: Sometimes true    Ran Out of Food in the Last Year: Sometimes true  Transportation Needs: No Transportation Needs (05/13/2024)   PRAPARE - Administrator, Civil Service (Medical): No    Lack of Transportation (Non-Medical): No  Physical Activity: Sufficiently Active (05/13/2024)   Exercise Vital Sign  Days of Exercise per Week: 4 days    Minutes of Exercise per Session: 60 min  Stress: No Stress Concern Present (05/13/2024)   [employer] of Occupational Health - Occupational Stress Questionnaire    Feeling of Stress: Only a little  Social Connections: Not on file  Intimate Partner Violence: Not At Risk (05/13/2024)   Humiliation, Afraid, Rape, and Kick questionnaire    Fear of Current or Ex-Partner: No    Emotionally Abused: No    Physically Abused: No    Sexually Abused: No     Current Outpatient Medications:    escitalopram  (LEXAPRO ) 20 MG tablet, TAKE 1 TABLET(20 MG) BY MOUTH DAILY, Disp: 90 tablet, Rfl: 1   famotidine  (PEPCID ) 20 MG tablet, TAKE 1 TABLET(20 MG) BY MOUTH TWICE DAILY, Disp: 60 tablet, Rfl: 3   hydrOXYzine  (ATARAX ) 10 MG tablet, Take 1 tablet (10 mg total) by mouth 3 (three) times daily as needed for anxiety., Disp: 60 tablet, Rfl: 1   celecoxib  (CELEBREX ) 100 MG capsule, TAKE 1 CAPSULE BY MOUTH 2 TIMES A DAY (Patient not taking: Reported on 05/13/2024), Disp: 20 capsule, Rfl: 0  No Known Allergies   ROS  Constitutional: Negative for fever or weight change.  Respiratory: Negative for cough and shortness of breath. Endorses residual cough from previous respiratory illness. Cardiovascular: Negative for chest pain or palpitations.  Gastrointestinal: Negative for abdominal pain, no bowel changes.  Musculoskeletal: Negative for gait problem or joint swelling.  Skin: Negative for rash.  Neurological: Negative for dizziness or headache.  No  other specific complaints in a complete review of systems (except as listed in HPI above).   Objective  Vitals:   05/13/24 1332  BP: 124/82  Pulse: 87  Resp: 18  SpO2: 99%  Weight: 238 lb 12.8 oz (108.3 kg)  Height: 5' 5 (1.651 m)    Body mass index is 39.74 kg/m.  Physical Exam Exam conducted with a chaperone present.  Constitutional:      Appearance: Normal appearance.  HENT:     Head: Normocephalic and atraumatic.     Right Ear: Tympanic membrane, ear canal and external ear normal.     Left Ear: Tympanic membrane, ear canal and external ear normal.     Nose: Nose normal.     Mouth/Throat:     Mouth: Mucous membranes are moist.     Pharynx: Oropharynx is clear.  Eyes:     Extraocular Movements: Extraocular movements intact.     Conjunctiva/sclera: Conjunctivae normal.     Pupils: Pupils are equal, round, and reactive to light.  Cardiovascular:     Rate and Rhythm: Normal rate and regular rhythm.     Heart sounds: Normal heart sounds.  Pulmonary:     Effort: Pulmonary effort is normal.     Breath sounds: Normal breath sounds.  Chest:  Breasts:    Tanner Score is 5.     Right: Normal.     Comments: Palpable non-tender lump present on in the mid outer quadrant of the left breast. Abdominal:     General: Bowel sounds are normal.     Palpations: Abdomen is soft.  Genitourinary:    General: Normal vulva.     Exam position: Lithotomy position.     Tanner stage (genital): 5.     Vagina: Normal.     Cervix: Normal.     Uterus: Normal.      Adnexa: Right adnexa normal.  Musculoskeletal:        General:  Normal range of motion.     Cervical back: Normal range of motion and neck supple.  Skin:    General: Skin is warm and dry.  Neurological:     General: No focal deficit present.     Mental Status: She is alert and oriented to person, place, and time. Mental status is at baseline.      No results found for this or any previous visit (from the past 2160  hours).    Fall Risk:    05/13/2024    1:34 PM 02/26/2024    2:19 PM 08/18/2022    1:42 PM 06/03/2021    8:20 AM 07/07/2020   10:01 AM  Fall Risk   Falls in the past year? 0 0 0 0 0  Number falls in past yr: 0 0 0 0 0  Injury with Fall? 0 0 0 0 0  Risk for fall due to :  No Fall Risks     Follow up Falls evaluation completed Falls prevention discussed;Education provided;Falls evaluation completed Falls evaluation completed  Falls evaluation completed       Data saved with a previous flowsheet row definition    Functional Status Survey: Is the patient deaf or have difficulty hearing?: No Does the patient have difficulty seeing, even when wearing glasses/contacts?: No Does the patient have difficulty concentrating, remembering, or making decisions?: No Does the patient have difficulty walking or climbing stairs?: No Does the patient have difficulty dressing or bathing?: No Does the patient have difficulty doing errands alone such as visiting a doctor's office or shopping?: No   Assessment & Plan  Problem List Items Addressed This Visit       Other   Class 1 obesity due to excess calories without serious comorbidity with body mass index (BMI) of 30.0 to 30.9 in adult   Relevant Orders   TSH   Other Visit Diagnoses       Screening for diabetes mellitus    -  Primary   Ordered labs.   Relevant Orders   Comprehensive metabolic panel with GFR   Hemoglobin A1c     Screening for cholesterol level       Ordered labs.   Relevant Orders   Lipid panel     Screening for deficiency anemia       Ordered labs.   Relevant Orders   CBC with Differential/Platelet     Annual physical exam       Relevant Orders   CBC with Differential/Platelet   Comprehensive metabolic panel with GFR   Hemoglobin A1c   Lipid panel   TSH     Screening for cervical cancer       Pap Smear performed.   Relevant Orders   Cytology - PAP     Mass of upper inner quadrant of left breast        recently had menstration,  monitor, if does not resolve will get mammogram and ultrasound        -USPSTF grade A and B recommendations reviewed with patient; age-appropriate recommendations, preventive care, screening tests, etc discussed and encouraged; healthy living encouraged; see AVS for patient education given to patient -Discussed importance of 150 minutes of physical activity weekly, eat two servings of fish weekly, eat one serving of tree nuts ( cashews, pistachios, pecans, almonds.SABRA) every other day, eat 6 servings of fruit/vegetables daily and drink plenty of water and avoid sweet beverages.   -Reviewed Health Maintenance: Yes  I have reviewed this encounter  including the documentation in this note and/or discussed this patient with the provider, Alexa Everhart SNP, I am certifying that I agree with the content of this note as supervising/preceptor nurse practitioner.  Mliss Spray, FNP-C [facility] 05/13/2024, 2:33 PM

## 2024-05-14 ENCOUNTER — Ambulatory Visit: Payer: Self-pay | Admitting: Nurse Practitioner

## 2024-05-14 LAB — COMPREHENSIVE METABOLIC PANEL WITH GFR
AG Ratio: 1.7 (calc) (ref 1.0–2.5)
ALT: 27 U/L (ref 6–29)
AST: 21 U/L (ref 10–30)
Albumin: 4.5 g/dL (ref 3.6–5.1)
Alkaline phosphatase (APISO): 59 U/L (ref 31–125)
BUN: 8 mg/dL (ref 7–25)
CO2: 25 mmol/L (ref 20–32)
Calcium: 9.5 mg/dL (ref 8.6–10.2)
Chloride: 103 mmol/L (ref 98–110)
Creat: 0.59 mg/dL (ref 0.50–0.96)
Globulin: 2.7 g/dL (ref 1.9–3.7)
Glucose, Bld: 90 mg/dL (ref 65–99)
Potassium: 4.1 mmol/L (ref 3.5–5.3)
Sodium: 139 mmol/L (ref 135–146)
Total Bilirubin: 0.4 mg/dL (ref 0.2–1.2)
Total Protein: 7.2 g/dL (ref 6.1–8.1)
eGFR: 126 mL/min/1.73m2 (ref >=60)

## 2024-05-14 LAB — HEMOGLOBIN A1C
Hgb A1c MFr Bld: 5.7 % — ABNORMAL HIGH (ref <5.7)
Mean Plasma Glucose: 117 mg/dL
eAG (mmol/L): 6.5 mmol/L

## 2024-05-14 LAB — CBC WITH DIFFERENTIAL/PLATELET
Absolute Lymphocytes: 3636 {cells}/uL (ref 850–3900)
Absolute Monocytes: 678 {cells}/uL (ref 200–950)
Basophils Absolute: 74 {cells}/uL (ref 0–200)
Basophils Relative: 0.7 %
Eosinophils Absolute: 170 {cells}/uL (ref 15–500)
Eosinophils Relative: 1.6 %
HCT: 41.1 % (ref 35.0–45.0)
Hemoglobin: 13.4 g/dL (ref 11.7–15.5)
MCH: 28.5 pg (ref 27.0–33.0)
MCHC: 32.6 g/dL (ref 32.0–36.0)
MCV: 87.3 fL (ref 80.0–100.0)
MPV: 10.4 fL (ref 7.5–12.5)
Monocytes Relative: 6.4 %
Neutro Abs: 6042 {cells}/uL (ref 1500–7800)
Neutrophils Relative %: 57 %
Platelets: 374 Thousand/uL (ref 140–400)
RBC: 4.71 Million/uL (ref 3.80–5.10)
RDW: 12.5 % (ref 11.0–15.0)
Total Lymphocyte: 34.3 %
WBC: 10.6 Thousand/uL (ref 3.8–10.8)

## 2024-05-14 LAB — LIPID PANEL
Cholesterol: 219 mg/dL — ABNORMAL HIGH (ref <200)
HDL: 49 mg/dL — ABNORMAL LOW (ref >=50)
LDL Cholesterol (Calc): 139 mg/dL — ABNORMAL HIGH
Non-HDL Cholesterol (Calc): 170 mg/dL — ABNORMAL HIGH (ref <130)
Total CHOL/HDL Ratio: 4.5 (calc) (ref <5.0)
Triglycerides: 177 mg/dL — ABNORMAL HIGH (ref <150)

## 2024-05-14 LAB — TSH: TSH: 1.21 m[IU]/L

## 2024-05-15 LAB — CYTOLOGY - PAP: Diagnosis: NEGATIVE

## 2025-05-14 ENCOUNTER — Encounter: Admitting: Nurse Practitioner
